# Patient Record
Sex: Female | Born: 1955 | Race: White | Hispanic: No | Marital: Married | Smoking: Never smoker
Health system: Southern US, Community
[De-identification: ages and names within clinical notes are randomized; demographics above are authoritative.]

## PROBLEM LIST (undated history)

## (undated) DIAGNOSIS — D219 Benign neoplasm of connective and other soft tissue, unspecified: Secondary | ICD-10-CM

## (undated) DIAGNOSIS — D649 Anemia, unspecified: Secondary | ICD-10-CM

## (undated) DIAGNOSIS — Z148 Genetic carrier of other disease: Secondary | ICD-10-CM

## (undated) DIAGNOSIS — R011 Cardiac murmur, unspecified: Secondary | ICD-10-CM

## (undated) DIAGNOSIS — M1990 Unspecified osteoarthritis, unspecified site: Secondary | ICD-10-CM

## (undated) HISTORY — PX: COLONOSCOPY: SHX174

## (undated) HISTORY — DX: Benign neoplasm of connective and other soft tissue, unspecified: D21.9

## (undated) HISTORY — DX: Unspecified osteoarthritis, unspecified site: M19.90

## (undated) HISTORY — DX: Anemia, unspecified: D64.9

## (undated) HISTORY — PX: TONSILLECTOMY: SUR1361

## (undated) HISTORY — DX: Cardiac murmur, unspecified: R01.1

## (undated) HISTORY — DX: Genetic carrier of other disease: Z14.8

---

## 2000-12-17 HISTORY — PX: APPENDECTOMY: SHX54

## 2000-12-17 HISTORY — PX: ABDOMINAL HYSTERECTOMY: SHX81

## 2011-10-31 ENCOUNTER — Other Ambulatory Visit: Payer: Self-pay | Admitting: Obstetrics and Gynecology

## 2011-10-31 DIAGNOSIS — R928 Other abnormal and inconclusive findings on diagnostic imaging of breast: Secondary | ICD-10-CM

## 2011-11-14 ENCOUNTER — Ambulatory Visit
Admission: RE | Admit: 2011-11-14 | Discharge: 2011-11-14 | Disposition: A | Discharge status: Home / Self Care | Payer: BC Managed Care – PPO | Source: Ambulatory Visit | Attending: Obstetrics and Gynecology | Admitting: Obstetrics and Gynecology

## 2011-11-14 DIAGNOSIS — R928 Other abnormal and inconclusive findings on diagnostic imaging of breast: Secondary | ICD-10-CM

## 2014-05-21 ENCOUNTER — Telehealth: Payer: Self-pay | Admitting: Obstetrics and Gynecology

## 2014-05-21 NOTE — Telephone Encounter (Signed)
Calling to confirm appt

## 2014-05-24 NOTE — Telephone Encounter (Signed)
CONFIRMED.

## 2014-05-28 ENCOUNTER — Encounter: Payer: Self-pay | Admitting: Obstetrics and Gynecology

## 2014-05-28 ENCOUNTER — Ambulatory Visit (INDEPENDENT_AMBULATORY_CARE_PROVIDER_SITE_OTHER): Payer: BC Managed Care – PPO | Admitting: Obstetrics and Gynecology

## 2014-05-28 VITALS — BP 122/80 | HR 70 | Resp 16 | Ht 60.0 in | Wt 176.4 lb

## 2014-05-28 DIAGNOSIS — Z0000 Encounter for general adult medical examination without abnormal findings: Secondary | ICD-10-CM

## 2014-05-28 DIAGNOSIS — B372 Candidiasis of skin and nail: Secondary | ICD-10-CM

## 2014-05-28 DIAGNOSIS — Z01419 Encounter for gynecological examination (general) (routine) without abnormal findings: Secondary | ICD-10-CM

## 2014-05-28 MED ORDER — NYSTATIN 100000 UNIT/GM EX POWD: Freq: Three times a day (TID) | CUTANEOUS | Status: DC

## 2014-05-28 NOTE — Progress Notes (Signed)
Patient ID: Mallory Lopez, female   DOB: 1956/02/01, 58 y.o.   MRN: [id number] GYNECOLOGY VISIT  PCP:   Berneta Sages, MD  Referring provider:   HPI: 58 y.o.   Married  Caucasian  female   [phone] with No LMP recorded. Patient has had a hysterectomy.   here for   AEX. Some vaginal dryness.  Uses water based lubricant.  Sufficient.  Hot flashes and night sweats resolved.   Some leakage of urine with forceful maneuvers. "I have stress incontinence." No pad use.  Does Kegel's but not often.   Some osteoarthritis. Sees Dr. Dagmar Hait.   Son getting married next year.   Hgb:    PCP Urine:  Neg  GYNECOLOGIC HISTORY: No LMP recorded. Patient has had a hysterectomy. Sexually active:  yes Partner preference: female Contraception:   Hysterectomy Menopausal hormone therapy: n/a DES exposure:  no Blood transfusions:  no Sexually transmitted diseases: no   GYN procedures and prior surgeries: Total Abdominal Hysterectomy  Last mammogram:   11-14-11 revealed prominent lymph node in right breast, otherwise normal:[facility].              Last pap and high risk HPV testing:   2012 wnl History of abnormal pap smear: no    OB History   Grav Para Term Preterm Abortions TAB SAB Ect Mult Living   3 2 2  1     2        LIFESTYLE: Exercise:  no             Tobacco: no Alcohol:  no Drug use:  no  OTHER HEALTH MAINTENANCE: Tetanus/TDap:  2009 Gardisil:             n/a Influenza:           09/2013 Zostavax:            n/a  Bone density:     n/a Colonoscopy:     05/2008 w[place] in [place].  Next colonoscopy 10 years.  Cholesterol check: 03/2014 wnl  Family History  Problem Relation Age of Onset  . Lung cancer Mother   . Prostate cancer Father   . Seizures Brother     Hx of Grand mal and Petite mal seizsures as teenager--none since  . Lung cancer Maternal Grandmother     There are no active problems to display for this patient.  Past Medical History  Diagnosis Date  . Anemia    prior to hysterectomy  . Hemochromatosis carrier   . Fibroid   . Heart murmur     functional    Past Surgical History  Procedure Laterality Date  . Abdominal hysterectomy  2002  . Appendectomy  2002    ALLERGIES: Codeine  No current outpatient prescriptions on file.   No current facility-administered medications for this visit.     ROS:  Pertinent items are noted in HPI.  SOCIAL HISTORY:  Married.   PHYSICAL EXAMINATION:    BP 122/80  Pulse 70  Resp 16  Ht 5' (1.524 m)  Wt 176 lb 6.4 oz (80.015 kg)  BMI 34.45 kg/m2   Wt Readings from Last 3 Encounters:  05/28/14 176 lb 6.4 oz (80.015 kg)     Ht Readings from Last 3 Encounters:  05/28/14 5' (1.524 m)    General appearance: alert, cooperative and appears stated age Head: Normocephalic, without obvious abnormality, atraumatic Neck: no adenopathy, supple, symmetrical, trachea midline and thyroid not enlarged, symmetric, no tenderness/mass/nodules Lungs: clear to  auscultation bilaterally Breasts: Inspection negative, No nipple retraction or dimpling, No nipple discharge or bleeding, No axillary or supraclavicular adenopathy, Normal to palpation without dominant masses Heart: regular rate and rhythm Abdomen: Pannus with erythematous patch on left side, soft, non-tender; no masses,  no organomegaly Extremities: extremities normal, atraumatic, no cyanosis or edema Skin: Skin color, texture, turgor normal. No rashes or lesions Lymph nodes: Cervical, supraclavicular, and axillary nodes normal. No abnormal inguinal nodes palpated Neurologic: Grossly normal  Pelvic: External genitalia:  no lesions              Urethra:  normal appearing urethra with no masses, tenderness or lesions              Bartholins and Skenes: normal                 Vagina: normal appearing vagina with normal color and discharge, no lesions              Cervix: absent              Pap and high risk HPV testing done: no.            Bimanual Exam:   Uterus:   absent                                      Adnexa: normal adnexa in size, nontender and no masses                                      Rectovaginal: Confirms                                      Anus:  normal sphincter tone, no lesions  ASSESSMENT  Normal gynecologic exam. Status post TAH. Candida of flexural skin.  Mild genuine stress incontinence.   PLAN  Mammogram recommended yearly.  [facility]. Pap smear and high risk HPV testing not indicated. Counseled on self breast exam, Calcium and vitamin D intake, exercise. Nystatin powder.  See Epic orders. Kegel's.  Return annually or prn   An After Visit Summary was printed and given to the patient.

## 2014-05-28 NOTE — Patient Instructions (Signed)

## 2014-06-25 ENCOUNTER — Other Ambulatory Visit: Payer: Self-pay

## 2014-06-25 DIAGNOSIS — Z1231 Encounter for screening mammogram for malignant neoplasm of breast: Secondary | ICD-10-CM

## 2014-07-02 ENCOUNTER — Ambulatory Visit
Admission: RE | Admit: 2014-07-02 | Discharge: 2014-07-02 | Disposition: A | Discharge status: Home / Self Care | Payer: BC Managed Care – PPO | Source: Ambulatory Visit

## 2014-07-02 DIAGNOSIS — Z1231 Encounter for screening mammogram for malignant neoplasm of breast: Secondary | ICD-10-CM

## 2014-07-21 ENCOUNTER — Encounter: Payer: Self-pay | Admitting: Obstetrics and Gynecology

## 2014-10-18 ENCOUNTER — Encounter: Payer: Self-pay | Admitting: Obstetrics and Gynecology

## 2015-04-04 ENCOUNTER — Telehealth: Payer: Self-pay | Admitting: Obstetrics and Gynecology

## 2015-04-04 NOTE — Telephone Encounter (Signed)
LMTCB about canceled appointment °

## 2015-06-03 ENCOUNTER — Ambulatory Visit: Payer: BC Managed Care – PPO | Admitting: Obstetrics and Gynecology

## 2015-06-08 ENCOUNTER — Encounter: Payer: Self-pay | Admitting: Obstetrics and Gynecology

## 2015-06-08 ENCOUNTER — Ambulatory Visit (INDEPENDENT_AMBULATORY_CARE_PROVIDER_SITE_OTHER): Payer: Managed Care, Other (non HMO) | Admitting: Obstetrics and Gynecology

## 2015-06-08 VITALS — BP 122/70 | HR 80 | Resp 16 | Ht 60.0 in | Wt 189.0 lb

## 2015-06-08 DIAGNOSIS — Z01419 Encounter for gynecological examination (general) (routine) without abnormal findings: Secondary | ICD-10-CM

## 2015-06-08 DIAGNOSIS — R1900 Intra-abdominal and pelvic swelling, mass and lump, unspecified site: Secondary | ICD-10-CM

## 2015-06-08 NOTE — Progress Notes (Signed)
Patient ID: Mallory Lopez, female   DOB: Oct 22, 1956, 59 y.o.   MRN: [id number]  59 y.o. G75P2012 Married Caucasian female here for annual exam.    Feels like she has gained weight.   Stress incontinence is not worse.  Doing Kegel exercises.  PCP:  Dr Radene Gunning  No LMP recorded. Patient has had a hysterectomy.          Sexually active: Yes.    The current method of family planning is status post hysterectomy.   Ovaries remain.  Exercising: Yes.    walking Smoker:  no  Health Maintenance: Pap:  2012 History of abnormal Pap:  no MMG:  07-02-14 3D WNL fibroglandular density [facility] Colonoscopy:  2009 WNL per patient  BMD:  2015   Result  Normal per patient.  Uncertain if she has some osteopenia of the hip. TDaP: 2014 per patient - PCP office Screening Labs:PCP does labs    reports that she has never smoked. She has never used smokeless tobacco. She reports that she does not drink alcohol or use illicit drugs.  Past Medical History  Diagnosis Date  . Anemia     prior to hysterectomy  . Hemochromatosis carrier   . Fibroid   . Heart murmur     functional    Past Surgical History  Procedure Laterality Date  . Abdominal hysterectomy  2002  . Appendectomy  2002    Current Outpatient Prescriptions  Medication Sig Dispense Refill  . [place]-Glucosamine-Vit D (GLUCOSAMINE COMPLEX PO) Take by mouth.    . cholecalciferol (VITAMIN D) 1000 UNITS tablet Take 1,000 Units by mouth daily.     No current facility-administered medications for this visit.    Family History  Problem Relation Age of Onset  . Lung cancer Mother   . Prostate cancer Father   . Seizures Brother     Hx of Grand mal and Petite mal seizsures as teenager--none since  . Lung cancer Maternal Grandmother     ROS:  Pertinent items are noted in HPI.  Otherwise, a comprehensive ROS was negative.  Exam:   BP 122/70 mmHg  Pulse 80  Resp 16  Ht 5' (1.524 m)  Wt 189 lb (85.73 kg)  BMI 36.91 kg/m2    General  appearance: alert, cooperative and appears stated age Head: Normocephalic, without obvious abnormality, atraumatic Neck: no adenopathy, supple, symmetrical, trachea midline and thyroid normal to inspection and palpation Lungs: clear to auscultation bilaterally Breasts: normal appearance, no masses or tenderness, Inspection negative, No nipple retraction or dimpling, No nipple discharge or bleeding, No axillary or supraclavicular adenopathy Heart: regular rate and rhythm Abdomen: soft, non-tender; bowel sounds normal; no masses,  no organomegaly Extremities: extremities normal, atraumatic, no cyanosis or edema Skin: Skin color, texture, turgor normal. No rashes or lesions Lymph nodes: Cervical, supraclavicular, and axillary nodes normal. No abnormal inguinal nodes palpated Neurologic: Grossly normal  Pelvic: External genitalia:  no lesions              Urethra:  normal appearing urethra with no masses, tenderness or lesions              Bartholins and Skenes: normal                 Vagina: normal appearing vagina with normal color and discharge, no lesions              Cervix: no lesions  Pap taken: No. Bimanual Exam:  Uterus:  uterus absent              Adnexa: 3 cm mass at vaginal apex.  Bowel versus adnexal mass.              Rectovaginal: Yes.  .  Confirms.              Anus:  normal sphincter tone, no lesions  Chaperone was present for exam.  Assessment:   Well woman visit with normal exam. Status post TAH.  Pelvic mass - bowel loop versus adnexal mass. Osteopenia?  BMD not available at this time.  Mild GSI.   Plan: Yearly mammogram recommended after age 59.  Recommended self breast exam.  Pap and HR HPV as above. Discussed Calcium, Vitamin D, regular exercise program including cardiovascular and weight bearing exercise. Labs performed.  No..     Return for pelvic ultrasound.  Pelvic exam findings discussed. Refills given on medications.  No..    Bone density  follow up through PCP.  Kegel's. Follow up annually and prn.   After visit summary provided.

## 2015-06-08 NOTE — Patient Instructions (Signed)

## 2015-06-30 ENCOUNTER — Ambulatory Visit (INDEPENDENT_AMBULATORY_CARE_PROVIDER_SITE_OTHER): Payer: Managed Care, Other (non HMO) | Admitting: Obstetrics and Gynecology

## 2015-06-30 ENCOUNTER — Ambulatory Visit (INDEPENDENT_AMBULATORY_CARE_PROVIDER_SITE_OTHER): Payer: Managed Care, Other (non HMO)

## 2015-06-30 ENCOUNTER — Encounter: Payer: Self-pay | Admitting: Obstetrics and Gynecology

## 2015-06-30 VITALS — BP 120/70 | HR 76 | Resp 18 | Ht 60.0 in | Wt 188.0 lb

## 2015-06-30 DIAGNOSIS — R1900 Intra-abdominal and pelvic swelling, mass and lump, unspecified site: Secondary | ICD-10-CM

## 2015-06-30 DIAGNOSIS — N8320 Unspecified ovarian cysts: Secondary | ICD-10-CM

## 2015-06-30 DIAGNOSIS — N83202 Unspecified ovarian cyst, left side: Secondary | ICD-10-CM

## 2015-06-30 NOTE — Patient Instructions (Signed)
Diagnostic Laparoscopy Laparoscopy is a surgical procedure. It is used to diagnose and treat diseases inside the belly (abdomen). It is usually a brief, common, and relatively simple procedure. The laparoscopeis a thin, lighted, pencil-sized instrument. It is like a telescope. It is inserted into your abdomen through a small cut (incision). Your caregiver can look at the organs inside your body through this instrument. He or she can see if there is anything abnormal. Laparoscopy can be done either in a hospital or outpatient clinic. You may be given a mild sedative to help you relax before the procedure. Once in the operating room, you will be given a drug to make you sleep (general anesthesia). Laparoscopy usually lasts less than 1 hour. After the procedure, you will be monitored in a recovery area until you are stable and doing well. Once you are home, it will take 2 to 3 days to fully recover. RISKS AND COMPLICATIONS  Laparoscopy has relatively few risks. Your caregiver will discuss the risks with you before the procedure. Some problems that can occur include:  Infection.  Bleeding.  Damage to other organs.  Anesthetic side effects. PROCEDURE Once you receive anesthesia, your surgeon inflates the abdomen with a harmless gas (carbon dioxide). This makes the organs easier to see. The laparoscope is inserted into the abdomen through a small incision. This allows your surgeon to see into the abdomen. Other small instruments are also inserted into the abdomen through other small openings. Many surgeons attach a video camera to the laparoscope to enlarge the view. During a diagnostic laparoscopy, the surgeon may be looking for inflammation, infection, or cancer. Your surgeon may take tissue samples(biopsies). The samples are sent to a specialist in looking at cells and tissue samples (pathologist). The pathologist examines them under a microscope. Biopsies can help to diagnose or confirm a  disease. AFTER THE PROCEDURE   The gas is released from inside the abdomen.  The incisions are closed with stitches (sutures). Because these incisions are small (usually less than 1/2 inch), there is usually minimal discomfort after the procedure. There may be some mild discomfort in the throat. This is from the tube placed in the throat while you were sleeping. You may have some mild abdominal discomfort. There may also be discomfort from the instrument placement incisions in the abdomen.  The recovery time is shortened as long as there are no complications.  You will rest in a recovery room until stable and doing well. As long as there are no complications, you may be allowed to go home. FINDING OUT THE RESULTS OF YOUR TEST Not all test results are available during your visit. If your test results are not back during the visit, make an appointment with your caregiver to find out the results. Do not assume everything is normal if you have not heard from your caregiver or the medical facility. It is important for you to follow up on all of your test results. HOME CARE INSTRUCTIONS   Take all medicines as directed.  Only take over-the-counter or prescription medicines for pain, discomfort, or fever as directed by your caregiver.  Resume daily activities as directed.  Showers are preferred over baths.  You may resume sexual activities in 1 week or as directed.  Do not drive while taking narcotics. SEEK MEDICAL CARE IF:   There is increasing abdominal pain.  There is new pain in the shoulders (shoulder strap areas).  You feel lightheaded or faint.  You have the chills.  You or   your child has an oral temperature above 102 F (38.9 C).  There is pus-like (purulent) drainage from any of the wounds.  You are unable to pass gas or have a bowel movement.  You feel sick to your stomach (nauseous) or throw up (vomit). MAKE SURE YOU:   Understand these instructions.  Will watch  your condition.  Will get help right away if you are not doing well or get worse. Document Released: 03/11/2001 Document Revised: 03/30/2013 Document Reviewed: 12/03/2007 [facility] Patient Information 2015 [place], [place]. This information is not intended to replace advice given to you by your health care provider. Make sure you discuss any questions you have with your health care provider.  Bilateral Salpingo-Oophorectomy Bilateral salpingo-oophorectomy is the surgical removal of both fallopian tubes and both ovaries. The ovaries are small organs that produce eggs in women. The fallopian tubes transport the egg from the ovary to the womb (uterus). Usually, when this surgery is done, the uterus was previously removed. A bilateral salpingo-oophorectomy may be done to treat cancer or to reduce the risk of cancer in women who are at high risk. Removing both fallopian tubes and both ovaries will make you unable to become pregnant (sterile). It will also put you into menopause so that you will no longer have menstrual periods and may have menopausal symptoms such as hot flashes, night sweats, and mood changes. It will not affect your sex drive. LET [facility] CARE PROVIDER KNOW ABOUT:  Any allergies you have.  All medicines you are taking, including vitamins, herbs, eye drops, creams, and over-the-counter medicines.  Previous problems you or members of your family have had with the use of anesthetics.  Any blood disorders you have.  Previous surgeries you have had.  Medical conditions you have. RISKS AND COMPLICATIONS Generally, this is a safe procedure. However, as with any procedure, complications can occur. Possible complications include:  Injury to surrounding organs.  Bleeding.  Infection.  Blood clots in the legs or lungs.  Problems related to anesthesia. BEFORE THE PROCEDURE  Ask your health care provider about changing or stopping your regular medicines. You may need to stop  taking certain medicines, such as aspirin or blood thinners, at least 1 week before the surgery.  Do not eat or drink anything for at least 8 hours before the surgery.  If you smoke, do not smoke for at least 2 weeks before the surgery.  Make plans to have someone drive you home after the procedure or after your hospital stay. Also arrange for someone to help you with activities during recovery. PROCEDURE   You will be given medicine to help you relax before the procedure (sedative). You will then be given medicine to make you sleep through the procedure (general anesthetic). These medicines will be given through an IV access tube that is put into one of your veins.  Once you are asleep, your lower abdomen will be shaved and cleaned. A thin, flexible tube (catheter) will be placed in your bladder.  The surgeon may use a laparoscopic, robotic, or open technique for this surgery:  In the laparoscopic technique, the surgery is done through two small cuts (incisions) in the abdomen. A thin, lighted tube with a tiny camera on the end (laparoscope) is inserted into one of the incisions. The tools needed for the procedure are put through the other incision.  A robotic technique may be chosen to perform complex surgery in a small space. In the robotic technique, small incisions will be  made. A camera and surgical instruments are passed through the incisions. Surgical instruments will be controlled with the help of a robotic arm.  In the open technique, the surgery is done through one large incision in the abdomen.  Using any of these techniques, the surgeon removes the fallopian tubes and ovaries. The blood vessels will be clamped and tied.  The surgeon then uses staples or stitches to close the incision or incisions. AFTER THE PROCEDURE  You will be taken to a recovery area where you will be monitored for 1 to 3 hours. Your blood pressure, pulse, and temperature will be checked often. You will  remain in the recovery area until you are stable and waking up.  If the laparoscopic technique was used, you may be allowed to go home after several hours. You may have some shoulder pain after the laparoscopic procedure. This is normal and usually goes away in a day or two.  If the open technique was used, you will be admitted to the hospital for a couple of days.  You will be given pain medicine as needed.  The IV access tube and catheter will be removed before you are discharged. Document Released: 12/03/2005 Document Revised: 12/08/2013 Document Reviewed: 05/27/2013 [facility] Patient Information 2015 [place], [place]. This information is not intended to replace advice given to you by your health care provider. Make sure you discuss any questions you have with your health care provider.  Ovarian Cyst An ovarian cyst is a fluid-filled sac that forms on an ovary. The ovaries are small organs that produce eggs in women. Various types of cysts can form on the ovaries. Most are not cancerous. Many do not cause problems, and they often go away on their own. Some may cause symptoms and require treatment. Common types of ovarian cysts include:  Functional cysts--These cysts may occur every month during the menstrual cycle. This is normal. The cysts usually go away with the next menstrual cycle if the woman does not get pregnant. Usually, there are no symptoms with a functional cyst.  Endometrioma cysts--These cysts form from the tissue that lines the uterus. They are also called "chocolate cysts" because they become filled with blood that turns brown. This type of cyst can cause pain in the lower abdomen during intercourse and with your menstrual period.  Cystadenoma cysts--This type develops from the cells on the outside of the ovary. These cysts can get very big and cause lower abdomen pain and pain with intercourse. This type of cyst can twist on itself, cut off its blood supply, and cause severe  pain. It can also easily rupture and cause a lot of pain.  Dermoid cysts--This type of cyst is sometimes found in both ovaries. These cysts may contain different kinds of body tissue, such as skin, teeth, hair, or cartilage. They usually do not cause symptoms unless they get very big.  Theca lutein cysts--These cysts occur when too much of a certain hormone (human chorionic gonadotropin) is produced and overstimulates the ovaries to produce an egg. This is most common after procedures used to assist with the conception of a baby (in vitro fertilization). CAUSES   Fertility drugs can cause a condition in which multiple large cysts are formed on the ovaries. This is called ovarian hyperstimulation syndrome.  A condition called polycystic ovary syndrome can cause hormonal imbalances that can lead to nonfunctional ovarian cysts. SIGNS AND SYMPTOMS  Many ovarian cysts do not cause symptoms. If symptoms are present, they may include:  Pelvic pain or pressure.  Pain in the lower abdomen.  Pain during sexual intercourse.  Increasing girth (swelling) of the abdomen.  Abnormal menstrual periods.  Increasing pain with menstrual periods.  Stopping having menstrual periods without being pregnant. DIAGNOSIS  These cysts are commonly found during a routine or annual pelvic exam. Tests may be ordered to find out more about the cyst. These tests may include:  Ultrasound.  X-ray of the pelvis.  CT scan.  MRI.  Blood tests. TREATMENT  Many ovarian cysts go away on their own without treatment. Your health care provider may want to check your cyst regularly for 2-3 months to see if it changes. For women in menopause, it is particularly important to monitor a cyst closely because of the higher rate of ovarian cancer in menopausal women. When treatment is needed, it may include any of the following:  A procedure to drain the cyst (aspiration). This may be done using a long needle and ultrasound. It  can also be done through a laparoscopic procedure. This involves using a thin, lighted tube with a tiny camera on the end (laparoscope) inserted through a small incision.  Surgery to remove the whole cyst. This may be done using laparoscopic surgery or an open surgery involving a larger incision in the lower abdomen.  Hormone treatment or birth control pills. These methods are sometimes used to help dissolve a cyst. HOME CARE INSTRUCTIONS   Only take over-the-counter or prescription medicines as directed by your health care provider.  Follow up with your health care provider as directed.  Get regular pelvic exams and Pap tests. SEEK MEDICAL CARE IF:   Your periods are late, irregular, or painful, or they stop.  Your pelvic pain or abdominal pain does not go away.  Your abdomen becomes larger or swollen.  You have pressure on your bladder or trouble emptying your bladder completely.  You have pain during sexual intercourse.  You have feelings of fullness, pressure, or discomfort in your stomach.  You lose weight for no apparent reason.  You feel generally ill.  You become constipated.  You lose your appetite.  You develop acne.  You have an increase in body and facial hair.  You are gaining weight, without changing your exercise and eating habits.  You think you are pregnant. SEEK IMMEDIATE MEDICAL CARE IF:   You have increasing abdominal pain.  You feel sick to your stomach (nauseous), and you throw up (vomit).  You develop a fever that comes on suddenly.  You have abdominal pain during a bowel movement.  Your menstrual periods become heavier than usual. MAKE SURE YOU:  Understand these instructions.  Will watch your condition.  Will get help right away if you are not doing well or get worse. Document Released: 12/03/2005 Document Revised: 12/08/2013 Document Reviewed: 08/10/2013 [facility] Patient Information 2015 [place], [place]. This information is not  intended to replace advice given to you by your health care provider. Make sure you discuss any questions you have with your health care provider.

## 2015-06-30 NOTE — Progress Notes (Signed)
Subjective  59 y.o. [id number]  Caucasian female here for pelvic ultrasound for right adnexal mass palpated at annual exam.  Status post total abdominal hysterectomy for uterine fibroids.  Ovaries remain.   Denies pelvic pain.   No hot flashes.    Objective  Pelvic ultrasound images and report reviewed with patient.  Uterus - absent.  EMS - NA Ovaries - Right ovary normal.  Left adnexal mass 5.2 x 5.6 cm with floating? Calcification 5 mm. Free fluid - no.        Assessment  Left adnexal mass.  Complex by criteria.  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 Status post TAH.  Plan  Discussion of ovarian cysts.  CA125.  Discussed laparoscopic bilateral salpingo-oophorectomy (with pelvic washings) versus follow up ultrasound in 6 weeks.  I am leaning toward surgical care as there is a solid? Feature to the mass and she had no palpable mass last year with my pelvic exam of her.  Written information on cysts, laparoscopy , BSO.  __15_____ minutes face to face time of which over 50% was spent in counseling.   After visit summary to patient.

## 2015-07-01 LAB — CA 125: CA 125: 7 U/mL (ref <35)

## 2015-07-05 ENCOUNTER — Telehealth: Payer: Self-pay | Admitting: Obstetrics and Gynecology

## 2015-07-05 NOTE — Telephone Encounter (Signed)
Called patient to review benefits for surgery. Left voicemail to call back and review. °

## 2015-07-06 NOTE — Telephone Encounter (Signed)
Call to patient, left message to call back. 

## 2015-07-15 ENCOUNTER — Encounter (HOSPITAL_COMMUNITY): Payer: Self-pay | Admitting: *Deleted

## 2015-07-20 ENCOUNTER — Telehealth: Payer: Self-pay | Admitting: *Deleted

## 2015-07-20 NOTE — Telephone Encounter (Signed)
See previous phone encounter. Patient advised surgery is scheduled for Tuesday 08-16-15 at 0730 at [facility] as previously discussed.  Surgery instruction sheet reviewed and printed copy mailed, see scanned copy.   Routing to provider for final review. Patient agreeable to disposition. Will close encounter.

## 2015-07-20 NOTE — Telephone Encounter (Signed)
See next phone message.  Routing to provider for final review.  Will close encounter.

## 2015-07-27 ENCOUNTER — Ambulatory Visit: Payer: Managed Care, Other (non HMO) | Admitting: Obstetrics and Gynecology

## 2015-07-29 ENCOUNTER — Ambulatory Visit (INDEPENDENT_AMBULATORY_CARE_PROVIDER_SITE_OTHER): Payer: Managed Care, Other (non HMO) | Admitting: Obstetrics and Gynecology

## 2015-07-29 ENCOUNTER — Encounter: Payer: Self-pay | Admitting: Obstetrics and Gynecology

## 2015-07-29 VITALS — BP 128/76 | HR 76 | Ht 60.0 in | Wt 189.8 lb

## 2015-07-29 DIAGNOSIS — N83202 Unspecified ovarian cyst, left side: Secondary | ICD-10-CM

## 2015-07-29 DIAGNOSIS — N8320 Unspecified ovarian cysts: Secondary | ICD-10-CM

## 2015-07-29 DIAGNOSIS — Z0289 Encounter for other administrative examinations: Secondary | ICD-10-CM

## 2015-07-29 NOTE — Progress Notes (Signed)
Patient ID: Mallory Lopez, female   DOB: [birth year], 59 y.o.   MRN: [id number] GYNECOLOGY  VISIT   HPI: 59 y.o.   Married  Caucasian  female   [phone] with No LMP recorded. Patient has had a hysterectomy.   here for surgery consult.   Husband is present for the entire visit.   Left adnexal mass palpated at annual examination 06/08/15.  Pelvic ultrasound 06/30/15: Uterus - absent.  EMS - NA Ovaries - Right ovary normal. Left adnexal mass 5.2 x 5.6 cm with floating? Calcification 5 mm. Free fluid - no.   CA125 = 7 on 06/30/15.  GYNECOLOGIC HISTORY: No LMP recorded. Patient has had a hysterectomy. Contraception: Hysterectomy Menopausal hormone therapy: none Last mammogram: 07-02-14 Density Cat[facility]B/Neg:[facility]. Last pap smear: 2012 normal        OB History    Gravida Para Term Preterm AB TAB SAB Ectopic Multiple Living   3 2 2  1     2          There are no active problems to display for this patient.   Past Medical History  Diagnosis Date  . Anemia     prior to hysterectomy  . Hemochromatosis carrier   . Fibroid   . Heart murmur     functional    Past Surgical History  Procedure Laterality Date  . Abdominal hysterectomy  2002  . Appendectomy  2002    Current Outpatient Prescriptions  Medication Sig Dispense Refill  . Boswellia-Glucosamine-Vit D (GLUCOSAMINE COMPLEX PO) Take by mouth.    . Calcium Citrate-Vitamin D (CALCIUM + D PO) Take by mouth.    . cholecalciferol (VITAMIN D) 1000 UNITS tablet Take 1,000 Units by mouth daily.     No current facility-administered medications for this visit.     ALLERGIES: Codeine  Family History  Problem Relation Age of Onset  . Lung cancer Mother   . Prostate cancer Father   . Seizures Brother     Hx of Grand mal and Petite mal seizsures as teenager--none since  . Lung cancer Maternal Grandmother     Social History   Social History  . Marital Status: Married    Spouse Name: N/A  . Number of Children: N/A  .  Years of Education: N/A   Occupational History  . Not on file.   Social History Main Topics  . Smoking status: Never Smoker   . Smokeless tobacco: Never Used  . Alcohol Use: No  . Drug Use: No  . Sexual Activity:    Partners: Male    Birth Control/ Protection: Surgical     Comment: Hysterectomy   Other Topics Concern  . Not on file   Social History Narrative    ROS:  Pertinent items are noted in HPI.  PHYSICAL EXAMINATION:    BP 128/76 mmHg  Pulse 76  Ht 5' (1.524 m)  Wt 189 lb 12.8 oz (86.093 kg)  BMI 37.07 kg/m2    General appearance: alert, cooperative and appears stated age Head: Normocephalic, without obvious abnormality, atraumatic Neck: no adenopathy, supple, symmetrical, trachea midline and thyroid normal to inspection and palpation Lungs: clear to auscultation bilaterally Heart: regular rate and rhythmAbdomen: Pfannenstiel incision, soft, non-tender; bowel sounds normal; no masses,  no organomegaly Extremities: extremities normal, atraumatic, no cyanosis or edema Skin: Skin color, texture, turgor normal. No rashes or lesions Lymph nodes: Cervical, supraclavicular, and axillary nodes normal. No abnormal inguinal nodes palpated Neurologic: Grossly normal  Pelvic: External genitalia:  no  lesions              Urethra:  normal appearing urethra with no masses, tenderness or lesions              Bartholins and Skenes: normal                 Vagina: normal appearing vagina with normal color and discharge, no lesions              Cervix: absent           Bimanual Exam:  Uterus:  uterus absent              Adnexa: Subtle mass of the left adnexa.  Right adnexa normal.               Rectovaginal: Yes.  .  Confirms.              Anus:  normal sphincter tone, no lesions  Chaperone was present for exam.  Informal transvaginal ultrasound performed and left adnexal mass present with calcification of the wall noted.   ASSESSMENT  Status post TAH. Left adnexal mass  with complex feature.  Normal CA125.   PLAN  Proceed with laparoscopic bilateral salpingo-oophorectomy and collection of pelvic washings.  I reviewed benefits and risks which include but are not limited to bleeding, infection, damage to surrounding organs, pneumonia, reaction to anesthesia, DVt, PE, death, need for reoperation, hernia formation, continued pain, and need to convert to a traditional laparotomy incision to complete the procedure.  Discussed surgical expectations and recovery.  Patient wishes to proceed with surgery.  She will do a magnesium citrate bowel prep.       An After Visit Summary was printed and given to the patient.  _25_____ minutes face to face time of which over 50% was spent in counseling.

## 2015-08-01 ENCOUNTER — Telehealth: Payer: Self-pay | Admitting: *Deleted

## 2015-08-01 NOTE — Telephone Encounter (Signed)
Call to patient. Notified that surgery start time has been adjusted to 1030 on 08-16-15. Arrive at 0900. Patietn voiced understanding and is agreeable.  Routing to provider for final review. Will close encounter.

## 2015-08-15 NOTE — H&P (Signed)
Nunzio Cobbs, MD at 07/29/2015 3:01 PM     Status: Signed       Expand All Collapse All   Patient ID: Mallory Lopez, female DOB: 19-Oct-1956, 59 y.o. MRN: [id number] GYNECOLOGY VISIT  HPI: 59 y.o. Married Caucasian female  [phone] with No LMP recorded. Patient has had a hysterectomy.  here for surgery consult.  Husband is present for the entire visit.   Left adnexal mass palpated at annual examination 06/08/15.  Pelvic ultrasound 06/30/15: Uterus - absent.  EMS - NA Ovaries - Right ovary normal. Left adnexal mass 5.2 x 5.6 cm with floating? Calcification 5 mm. Free fluid - no.   CA125 = 7 on 06/30/15.  GYNECOLOGIC HISTORY: No LMP recorded. Patient has had a hysterectomy. Contraception: Hysterectomy Menopausal hormone therapy: none Last mammogram: 07-02-14 Density Cat[facility]B/Neg:[facility]. Last pap smear: 2012 normal   OB History    Gravida Para Term Preterm AB TAB SAB Ectopic Multiple Living   3 2 2  1     2        There are no active problems to display for this patient.   Past Medical History  Diagnosis Date  . Anemia     prior to hysterectomy  . Hemochromatosis carrier   . Fibroid   . Heart murmur     functional    Past Surgical History  Procedure Laterality Date  . Abdominal hysterectomy  2002  . Appendectomy  2002    Current Outpatient Prescriptions  Medication Sig Dispense Refill  . Boswellia-Glucosamine-Vit D (GLUCOSAMINE COMPLEX PO) Take by mouth.    . Calcium Citrate-Vitamin D (CALCIUM + D PO) Take by mouth.    . cholecalciferol (VITAMIN D) 1000 UNITS tablet Take 1,000 Units by mouth daily.     No current facility-administered medications for this visit.     ALLERGIES: Codeine  Family History  Problem Relation Age of Onset  . Lung cancer Mother   . Prostate cancer Father   . Seizures Brother     Hx of  Grand mal and Petite mal seizsures as teenager--none since  . Lung cancer Maternal Grandmother     Social History   Social History  . Marital Status: Married    Spouse Name: N/A  . Number of Children: N/A  . Years of Education: N/A   Occupational History  . Not on file.   Social History Main Topics  . Smoking status: Never Smoker   . Smokeless tobacco: Never Used  . Alcohol Use: No  . Drug Use: No  . Sexual Activity:    Partners: Male    Birth Control/ Protection: Surgical     Comment: Hysterectomy   Other Topics Concern  . Not on file   Social History Narrative    ROS: Pertinent items are noted in HPI.  PHYSICAL EXAMINATION:   BP 128/76 mmHg  Pulse 76  Ht 5' (1.524 m)  Wt 189 lb 12.8 oz (86.093 kg)  BMI 37.07 kg/m2  General appearance: alert, cooperative and appears stated age Head: Normocephalic, without obvious abnormality, atraumatic Neck: no adenopathy, supple, symmetrical, trachea midline and thyroid normal to inspection and palpation Lungs: clear to auscultation bilaterally Heart: regular rate and rhythmAbdomen: Pfannenstiel incision, soft, non-tender; bowel sounds normal; no masses, no organomegaly Extremities: extremities normal, atraumatic, no cyanosis or edema Skin: Skin color, texture, turgor normal. No rashes or lesions Lymph nodes: Cervical, supraclavicular, and axillary nodes normal. No abnormal inguinal nodes palpated Neurologic: Grossly normal  Pelvic: External  genitalia: no lesions  Urethra: normal appearing urethra with no masses, tenderness or lesions  Bartholins and Skenes: normal   Vagina: normal appearing vagina with normal color and discharge, no lesions  Cervix: absent   Bimanual Exam: Uterus: uterus absent  Adnexa: Subtle mass of the left adnexa. Right adnexa normal.    Rectovaginal: Yes. . Confirms.  Anus: normal sphincter tone, no lesions  Chaperone was present for exam.  Informal transvaginal ultrasound performed and left adnexal mass present with calcification of the wall noted.   ASSESSMENT  Status post TAH. Left adnexal mass with complex feature. Normal CA125.  PLAN  Proceed with laparoscopic bilateral salpingo-oophorectomy and collection of pelvic washings.  I reviewed benefits and risks which include but are not limited to bleeding, infection, damage to surrounding organs, pneumonia, reaction to anesthesia, DVt, PE, death, need for reoperation, hernia formation, continued pain, and need to convert to a traditional laparotomy incision to complete the procedure.  Discussed surgical expectations and recovery.  Patient wishes to proceed with surgery.  She will do a magnesium citrate bowel prep.    An After Visit Summary was printed and given to the patient.  _25_____ minutes face to face time of which over 50% was spent in counseling.

## 2015-08-16 ENCOUNTER — Ambulatory Visit (HOSPITAL_COMMUNITY): Payer: Managed Care, Other (non HMO) | Admitting: Anesthesiology

## 2015-08-16 ENCOUNTER — Ambulatory Visit (HOSPITAL_COMMUNITY)
Admission: RE | Admit: 2015-08-16 | Discharge: 2015-08-17 | Disposition: A | Discharge status: Home / Self Care | Payer: Managed Care, Other (non HMO) | Source: Ambulatory Visit | Attending: Obstetrics and Gynecology | Admitting: Obstetrics and Gynecology

## 2015-08-16 ENCOUNTER — Encounter (HOSPITAL_COMMUNITY): Payer: Self-pay | Admitting: Anesthesiology

## 2015-08-16 ENCOUNTER — Encounter (HOSPITAL_COMMUNITY)
Admission: RE | Disposition: A | Discharge status: Home / Self Care | Payer: Self-pay | Source: Ambulatory Visit | Attending: Obstetrics and Gynecology

## 2015-08-16 DIAGNOSIS — N838 Other noninflammatory disorders of ovary, fallopian tube and broad ligament: Secondary | ICD-10-CM | POA: Insufficient documentation

## 2015-08-16 DIAGNOSIS — Z90722 Acquired absence of ovaries, bilateral: Secondary | ICD-10-CM

## 2015-08-16 DIAGNOSIS — N736 Female pelvic peritoneal adhesions (postinfective): Secondary | ICD-10-CM

## 2015-08-16 DIAGNOSIS — Z90710 Acquired absence of both cervix and uterus: Secondary | ICD-10-CM | POA: Diagnosis not present

## 2015-08-16 DIAGNOSIS — N8320 Unspecified ovarian cysts: Secondary | ICD-10-CM

## 2015-08-16 DIAGNOSIS — D279 Benign neoplasm of unspecified ovary: Secondary | ICD-10-CM | POA: Diagnosis not present

## 2015-08-16 HISTORY — PX: CYSTOSCOPY: SHX5120

## 2015-08-16 HISTORY — PX: LAPAROSCOPIC BILATERAL SALPINGO OOPHERECTOMY: SHX5890

## 2015-08-16 HISTORY — PX: LAPAROSCOPIC LYSIS OF ADHESIONS: SHX5905

## 2015-08-16 LAB — BASIC METABOLIC PANEL
ANION GAP: 9 (ref 5–15)
BUN: 13 mg/dL (ref 6–20)
CALCIUM: 9.3 mg/dL (ref 8.9–10.3)
CO2: 28 mmol/L (ref 22–32)
Chloride: 103 mmol/L (ref 101–111)
Creatinine, Ser: 0.76 mg/dL (ref 0.44–1.00)
GFR calc Af Amer: >60 mL/min (ref >60)
GLUCOSE: 103 mg/dL — AB (ref 65–99)
Potassium: 3.9 mmol/L (ref 3.5–5.1)
Sodium: 140 mmol/L (ref 135–145)

## 2015-08-16 LAB — CBC
HCT: 43.7 % (ref 36.0–46.0)
Hemoglobin: 15.9 g/dL — ABNORMAL HIGH (ref 12.0–15.0)
MCH: 30.2 pg (ref 26.0–34.0)
MCHC: 36.4 g/dL — ABNORMAL HIGH (ref 30.0–36.0)
MCV: 83.1 fL (ref 78.0–100.0)
PLATELETS: 212 10*3/uL (ref 150–400)
RBC: 5.26 MIL/uL — ABNORMAL HIGH (ref 3.87–5.11)
RDW: 13.5 % (ref 11.5–15.5)
WBC: 9 10*3/uL (ref 4.0–10.5)

## 2015-08-16 SURGERY — SALPINGO-OOPHORECTOMY, BILATERAL, LAPAROSCOPIC
Anesthesia: General | Site: Bladder

## 2015-08-16 MED ORDER — LACTATED RINGERS IV SOLN
INTRAVENOUS | Status: DC
  Administered 2015-08-16 (×4): via INTRAVENOUS

## 2015-08-16 MED ORDER — HYDROMORPHONE HCL 1 MG/ML IJ SOLN
INTRAMUSCULAR | Status: AC
  Filled 2015-08-16: qty 1

## 2015-08-16 MED ORDER — PROPOFOL 10 MG/ML IV BOLUS
INTRAVENOUS | Status: DC | PRN
  Administered 2015-08-16: 170 mg via INTRAVENOUS

## 2015-08-16 MED ORDER — MORPHINE SULFATE (PF) 4 MG/ML IV SOLN: 2.0000 mg | INTRAVENOUS | Status: DC | PRN

## 2015-08-16 MED ORDER — MIDAZOLAM HCL 2 MG/2ML IJ SOLN
INTRAMUSCULAR | Status: DC | PRN
  Administered 2015-08-16: 2 mg via INTRAVENOUS

## 2015-08-16 MED ORDER — ONDANSETRON HCL 4 MG/2ML IJ SOLN: 4.0000 mg | Freq: Four times a day (QID) | INTRAMUSCULAR | Status: DC | PRN

## 2015-08-16 MED ORDER — SCOPOLAMINE 1 MG/3DAYS TD PT72
MEDICATED_PATCH | TRANSDERMAL | Status: AC
  Administered 2015-08-16: 1.5 mg via TRANSDERMAL
  Filled 2015-08-16: qty 1

## 2015-08-16 MED ORDER — IBUPROFEN 600 MG PO TABS: 600.0000 mg | ORAL_TABLET | Freq: Four times a day (QID) | ORAL | Status: DC | PRN

## 2015-08-16 MED ORDER — ONDANSETRON HCL 4 MG/2ML IJ SOLN
INTRAMUSCULAR | Status: AC
  Filled 2015-08-16: qty 2

## 2015-08-16 MED ORDER — PROPOFOL 10 MG/ML IV BOLUS
INTRAVENOUS | Status: AC
  Filled 2015-08-16: qty 20

## 2015-08-16 MED ORDER — FENTANYL CITRATE (PF) 100 MCG/2ML IJ SOLN
INTRAMUSCULAR | Status: AC
  Filled 2015-08-16: qty 4

## 2015-08-16 MED ORDER — NEOSTIGMINE METHYLSULFATE 10 MG/10ML IV SOLN
INTRAVENOUS | Status: DC | PRN
  Administered 2015-08-16: 3 mg via INTRAVENOUS

## 2015-08-16 MED ORDER — ACETAMINOPHEN 160 MG/5ML PO SOLN
ORAL | Status: AC
  Filled 2015-08-16: qty 40.6

## 2015-08-16 MED ORDER — ROCURONIUM BROMIDE 100 MG/10ML IV SOLN
INTRAVENOUS | Status: AC
  Filled 2015-08-16: qty 1

## 2015-08-16 MED ORDER — DEXAMETHASONE SODIUM PHOSPHATE 10 MG/ML IJ SOLN
INTRAMUSCULAR | Status: DC | PRN
  Administered 2015-08-16: 4 mg via INTRAVENOUS

## 2015-08-16 MED ORDER — HYDROMORPHONE HCL 1 MG/ML IJ SOLN
INTRAMUSCULAR | Status: DC | PRN
  Administered 2015-08-16: 1 mg via INTRAVENOUS

## 2015-08-16 MED ORDER — LACTATED RINGERS IV SOLN
INTRAVENOUS | Status: DC
  Administered 2015-08-17: via INTRAVENOUS

## 2015-08-16 MED ORDER — BUPIVACAINE HCL (PF) 0.25 % IJ SOLN
INTRAMUSCULAR | Status: AC
  Filled 2015-08-16: qty 30

## 2015-08-16 MED ORDER — LIDOCAINE HCL (CARDIAC) 20 MG/ML IV SOLN
INTRAVENOUS | Status: AC
  Filled 2015-08-16: qty 5

## 2015-08-16 MED ORDER — STERILE WATER FOR IRRIGATION IR SOLN
Status: DC | PRN
  Administered 2015-08-16: 1000 mL via INTRAVESICAL

## 2015-08-16 MED ORDER — FENTANYL CITRATE (PF) 250 MCG/5ML IJ SOLN
INTRAMUSCULAR | Status: AC
  Filled 2015-08-16: qty 25

## 2015-08-16 MED ORDER — CEFAZOLIN SODIUM-DEXTROSE 2-3 GM-% IV SOLR
INTRAVENOUS | Status: AC
  Filled 2015-08-16: qty 50

## 2015-08-16 MED ORDER — LIDOCAINE HCL (CARDIAC) 20 MG/ML IV SOLN
INTRAVENOUS | Status: DC | PRN
  Administered 2015-08-16: 80 mg via INTRAVENOUS

## 2015-08-16 MED ORDER — LACTATED RINGERS IV SOLN
INTRAVENOUS | Status: DC
  Administered 2015-08-16: 09:00:00 via INTRAVENOUS

## 2015-08-16 MED ORDER — SCOPOLAMINE 1 MG/3DAYS TD PT72
1.0000 | MEDICATED_PATCH | Freq: Once | TRANSDERMAL | Status: DC
  Administered 2015-08-16: 1.5 mg via TRANSDERMAL

## 2015-08-16 MED ORDER — HEPARIN SODIUM (PORCINE) 5000 UNIT/ML IJ SOLN
INTRAMUSCULAR | Status: DC | PRN
  Administered 2015-08-16: 5000 [IU]

## 2015-08-16 MED ORDER — DEXAMETHASONE SODIUM PHOSPHATE 4 MG/ML IJ SOLN
INTRAMUSCULAR | Status: AC
  Filled 2015-08-16: qty 1

## 2015-08-16 MED ORDER — CEFAZOLIN SODIUM-DEXTROSE 2-3 GM-% IV SOLR
2.0000 g | INTRAVENOUS | Status: AC
  Administered 2015-08-16: 2 g via INTRAVENOUS

## 2015-08-16 MED ORDER — ONDANSETRON HCL 4 MG PO TABS: 4.0000 mg | ORAL_TABLET | Freq: Four times a day (QID) | ORAL | Status: DC | PRN

## 2015-08-16 MED ORDER — BUPIVACAINE HCL (PF) 0.25 % IJ SOLN
INTRAMUSCULAR | Status: DC | PRN
  Administered 2015-08-16: 10 mL

## 2015-08-16 MED ORDER — LACTATED RINGERS IR SOLN
Status: DC | PRN
  Administered 2015-08-16: 3000 mL

## 2015-08-16 MED ORDER — OXYCODONE-ACETAMINOPHEN 5-325 MG PO TABS: 1.0000 | ORAL_TABLET | ORAL | Status: DC | PRN

## 2015-08-16 MED ORDER — HYDROMORPHONE HCL 1 MG/ML IJ SOLN: 0.2500 mg | INTRAMUSCULAR | Status: DC | PRN

## 2015-08-16 MED ORDER — ACETAMINOPHEN 160 MG/5ML PO SOLN
975.0000 mg | Freq: Once | ORAL | Status: AC
  Administered 2015-08-16: 975 mg via ORAL

## 2015-08-16 MED ORDER — GLYCOPYRROLATE 0.2 MG/ML IJ SOLN
INTRAMUSCULAR | Status: AC
  Filled 2015-08-16: qty 3

## 2015-08-16 MED ORDER — ROCURONIUM BROMIDE 100 MG/10ML IV SOLN
INTRAVENOUS | Status: DC | PRN
Start: 2015-08-16 — End: 2015-08-16
  Administered 2015-08-16 (×2): 10 mg via INTRAVENOUS
  Administered 2015-08-16: 40 mg via INTRAVENOUS
  Administered 2015-08-16: 20 mg via INTRAVENOUS
  Administered 2015-08-16: 10 mg via INTRAVENOUS

## 2015-08-16 MED ORDER — MENTHOL 3 MG MT LOZG: 1.0000 | LOZENGE | OROMUCOSAL | Status: DC | PRN

## 2015-08-16 MED ORDER — NEOSTIGMINE METHYLSULFATE 10 MG/10ML IV SOLN
INTRAVENOUS | Status: AC
  Filled 2015-08-16: qty 1

## 2015-08-16 MED ORDER — GLYCOPYRROLATE 0.2 MG/ML IJ SOLN
INTRAMUSCULAR | Status: DC | PRN
  Administered 2015-08-16: 0.6 mg via INTRAVENOUS

## 2015-08-16 MED ORDER — MIDAZOLAM HCL 2 MG/2ML IJ SOLN
INTRAMUSCULAR | Status: AC
  Filled 2015-08-16: qty 4

## 2015-08-16 MED ORDER — HEPARIN SODIUM (PORCINE) 5000 UNIT/ML IJ SOLN
INTRAMUSCULAR | Status: AC
  Filled 2015-08-16: qty 1

## 2015-08-16 MED ORDER — FENTANYL CITRATE (PF) 100 MCG/2ML IJ SOLN
INTRAMUSCULAR | Status: DC | PRN
  Administered 2015-08-16: 100 ug via INTRAVENOUS
  Administered 2015-08-16: 50 ug via INTRAVENOUS
  Administered 2015-08-16: 100 ug via INTRAVENOUS
  Administered 2015-08-16 (×4): 50 ug via INTRAVENOUS

## 2015-08-16 SURGICAL SUPPLY — 31 items
APPLICATOR SURGIFLO ENDO (HEMOSTASIS) ×4 IMPLANT
BARRIER ADHS 3X4 INTERCEED (GAUZE/BANDAGES/DRESSINGS) IMPLANT
BENZOIN TINCTURE PRP APPL 2/3 (GAUZE/BANDAGES/DRESSINGS) IMPLANT
CABLE HIGH FREQUENCY MONO STRZ (ELECTRODE) ×4 IMPLANT
CANISTER SUCT 3000ML (MISCELLANEOUS) ×4 IMPLANT
CLOTH BEACON ORANGE TIMEOUT ST (SAFETY) ×4 IMPLANT
DRSG COVADERM PLUS 2X2 (GAUZE/BANDAGES/DRESSINGS) ×8 IMPLANT
DRSG OPSITE POSTOP 3X4 (GAUZE/BANDAGES/DRESSINGS) ×4 IMPLANT
FORCEPS CUTTING 33CM 5MM (CUTTING FORCEPS) ×4 IMPLANT
GLOVE BIO SURGEON STRL SZ 6.5 (GLOVE) ×8 IMPLANT
GOWN STRL REUS W/TWL LRG LVL3 (GOWN DISPOSABLE) ×20 IMPLANT
LIQUID BAND (GAUZE/BANDAGES/DRESSINGS) ×4 IMPLANT
NS IRRIG 1000ML POUR BTL (IV SOLUTION) IMPLANT
PACK LAPAROSCOPY BASIN (CUSTOM PROCEDURE TRAY) ×4 IMPLANT
PAD POSITIONING PINK XL (MISCELLANEOUS) ×4 IMPLANT
POUCH SPECIMEN RETRIEVAL 10MM (ENDOMECHANICALS) IMPLANT
PROTECTOR NERVE ULNAR (MISCELLANEOUS) ×4 IMPLANT
SCISSORS LAP 5X35 DISP (ENDOMECHANICALS) ×4 IMPLANT
SET IRRIG TUBING LAPAROSCOPIC (IRRIGATION / IRRIGATOR) ×4 IMPLANT
SOLUTION ELECTROLUBE (MISCELLANEOUS) IMPLANT
SURGIFLO W/THROMBIN 8M KIT (HEMOSTASIS) ×4 IMPLANT
SUT VIC AB 0 CT1 27 (SUTURE) ×2
SUT VIC AB 0 CT1 27XBRD ANBCTR (SUTURE) ×6 IMPLANT
SUT VIC AB 2-0 UR6 27 (SUTURE) ×4 IMPLANT
SUT VIC AB 4-0 PS2 18 (SUTURE) ×4 IMPLANT
SYSTEM CARTER THOMASON II (TROCAR) ×4 IMPLANT
TOWEL OR 17X24 6PK STRL BLUE (TOWEL DISPOSABLE) ×8 IMPLANT
TRAY FOLEY CATH SILVER 14FR (SET/KITS/TRAYS/PACK) ×4 IMPLANT
TROCAR XCEL DIL TIP R 11M (ENDOMECHANICALS) ×4 IMPLANT
WARMER LAPAROSCOPE (MISCELLANEOUS) ×4 IMPLANT
WATER STERILE IRR 1000ML POUR (IV SOLUTION) IMPLANT

## 2015-08-16 NOTE — Brief Op Note (Signed)
08/16/2015  4:35 PM  PATIENT:  Mallory Lopez  59 y.o. female  PRE-OPERATIVE DIAGNOSIS:  left ovarian cyst  POST-OPERATIVE DIAGNOSIS:  left ovarian cyst, pelvic adhesions  PROCEDURE:  Procedure(s): LAPAROSCOPIC BILATERAL SALPINGO OOPHORECTOMY with collection of pelvic washings (Bilateral) CYSTOSCOPY (N/A) Extensive LAPAROSCOPIC LYSIS OF ADHESIONS (N/A)  SURGEON:  Surgeon(s) and Role:    * Brook E Yisroel Ramming, MD - Primary    * Salvadore Dom, MD - Assisting  PHYSICIAN ASSISTANT: NA  ASSISTANTS: Sumner Boast, MD   ANESTHESIA:   local and general  EBL:  Total I/O In: 1600 [I.V.:1600] Out: 275 [Urine:75; Blood:200]  BLOOD ADMINISTERED:none  DRAINS: none   LOCAL MEDICATIONS USED:  MARCAINE     SPECIMEN:  Source of Specimen:  Bilateral tubes and ovaries, pelvic washings.  DISPOSITION OF SPECIMEN:  PATHOLOGY  COUNTS:  YES  TOURNIQUET:  * No tourniquets in log *  DICTATION: .Other Dictation: Dictation Number    PLAN OF CARE: Admit for overnight observation  PATIENT DISPOSITION:  PACU - hemodynamically stable.   Delay start of Pharmacological VTE agent (>24hrs) due to surgical blood loss or risk of bleeding: not applicable

## 2015-08-16 NOTE — Progress Notes (Signed)
Day of Surgery Procedure(s) (LRB): LAPAROSCOPIC BILATERAL SALPINGO OOPHORECTOMY with collection of pelvic washings (Bilateral) CYSTOSCOPY (N/A) Extensive LAPAROSCOPIC LYSIS OF ADHESIONS (N/A)  Subjective: Patient reports good pain control.  Used the bed pan once to void.  Not out of bed yet.   Objective: I have reviewed patient's vital signs and intake and output. T max 99.2, T now 98.3, BP 104/54, P 81, RR 16 I/O - 2000 IV/ UO 75 cc, EBL 200 cc.  General: alert and cooperative Resp: clear to auscultation bilaterally Cardio: regular rate and rhythm, S1, S2 normal, no murmur, click, rub or gallop GI: soft, non-tender; bowel sounds normal; no masses,  no organomegaly and incision: clean, dry and intact Extremities: PAS and Ted hose on. DPs 2+ bilaterally.   Assessment: s/p Procedure(s): LAPAROSCOPIC BILATERAL SALPINGO OOPHORECTOMY with collection of pelvic washings (Bilateral) CYSTOSCOPY (N/A) Extensive LAPAROSCOPIC LYSIS OF ADHESIONS (N/A): stable  Plan: Advance diet Advance to PO medication CBC and BMP in am.   Ambulate tonight with assistance.  Surgical findings and procedure reviewed with patient.  Questions answered.       Mallory Lopez A Quincy Simmonds 08/16/2015, 7:14 PM

## 2015-08-16 NOTE — Transfer of Care (Signed)
Immediate Anesthesia Transfer of Care Note  Patient: Mallory Lopez  Procedure(s) Performed: Procedure(s): LAPAROSCOPIC BILATERAL SALPINGO OOPHORECTOMY with collection of pelvic washings (Bilateral) CYSTOSCOPY (N/A) Extensive LAPAROSCOPIC LYSIS OF ADHESIONS (N/A)  Patient Location: PACU  Anesthesia Type:General  Level of Consciousness: awake, alert , oriented and patient cooperative  Airway & Oxygen Therapy: Patient Spontanous Breathing and Patient connected to face mask oxygen  Post-op Assessment: Report given to RN and Post -op Vital signs reviewed and stable  Post vital signs: Reviewed and stable  Last Vitals:  TEMP 99.2 BP 104/55 HR 96 RR 12 [id number]  Complications: No apparent anesthesia complications

## 2015-08-16 NOTE — Progress Notes (Signed)
Update to History and Physical  No marked change in status since office pre-op visit.   Patient examined.   OK to proceed with surgery. 

## 2015-08-16 NOTE — Anesthesia Postprocedure Evaluation (Signed)
  Anesthesia Post-op Note  Patient: Mallory Lopez  Procedure(s) Performed: Procedure(s): LAPAROSCOPIC BILATERAL SALPINGO OOPHORECTOMY with collection of pelvic washings (Bilateral) CYSTOSCOPY (N/A) Extensive LAPAROSCOPIC LYSIS OF ADHESIONS (N/A)  Patient Location: PACU  Anesthesia Type:General  Level of Consciousness: awake, alert  and oriented  Airway and Oxygen Therapy: Patient Spontanous Breathing and Patient connected to nasal cannula oxygen  Post-op Pain: mild  Post-op Assessment: Post-op Vital signs reviewed, Patient's Cardiovascular Status Stable, Respiratory Function Stable, Patent Airway, No signs of Nausea or vomiting and Pain level controlled              Post-op Vital Signs: Reviewed and stable  Last Vitals:  Filed Vitals:   08/16/15 1730  BP: 110/57  Pulse: 85  Temp: 36.9 C  Resp: 14    Complications: No apparent anesthesia complications

## 2015-08-16 NOTE — Anesthesia Preprocedure Evaluation (Signed)
Anesthesia Evaluation  Patient identified by MRN, date of birth, ID band Patient awake    Reviewed: Allergy & Precautions, H&P , Patient's Chart, lab work & pertinent test results, reviewed documented beta blocker date and time   Airway Mallampati: II  TM Distance: >3 FB Neck ROM: full    Dental no notable dental hx.    Pulmonary  breath sounds clear to auscultation  Pulmonary exam normal       Cardiovascular Rhythm:regular Rate:Normal     Neuro/Psych    GI/Hepatic   Endo/Other    Renal/GU      Musculoskeletal   Abdominal   Peds  Hematology   Anesthesia Other Findings Normal echo; EKG  Reproductive/Obstetrics                             Anesthesia Physical Anesthesia Plan  ASA: II  Anesthesia Plan: General   Post-op Pain Management:    Induction: Intravenous  Airway Management Planned: Oral ETT  Additional Equipment:   Intra-op Plan:   Post-operative Plan: Extubation in OR  Informed Consent: I have reviewed the patients History and Physical, chart, labs and discussed the procedure including the risks, benefits and alternatives for the proposed anesthesia with the patient or authorized representative who has indicated his/her understanding and acceptance.   Dental Advisory Given and Dental advisory given  Plan Discussed with: CRNA and Surgeon  Anesthesia Plan Comments: (  Discussed general anesthesia, including possible nausea, instrumentation of airway, sore throat,pulmonary aspiration, etc. I asked if the were any outstanding questions, or  concerns before we proceeded. )        Anesthesia Quick Evaluation

## 2015-08-17 ENCOUNTER — Telehealth: Payer: Self-pay | Admitting: Obstetrics and Gynecology

## 2015-08-17 ENCOUNTER — Encounter (HOSPITAL_COMMUNITY): Payer: Self-pay | Admitting: Obstetrics and Gynecology

## 2015-08-17 DIAGNOSIS — D279 Benign neoplasm of unspecified ovary: Secondary | ICD-10-CM | POA: Diagnosis not present

## 2015-08-17 LAB — BASIC METABOLIC PANEL
ANION GAP: 8 (ref 5–15)
BUN: 11 mg/dL (ref 6–20)
CO2: 27 mmol/L (ref 22–32)
Calcium: 8.7 mg/dL — ABNORMAL LOW (ref 8.9–10.3)
Chloride: 104 mmol/L (ref 101–111)
Creatinine, Ser: 0.8 mg/dL (ref 0.44–1.00)
Glucose, Bld: 125 mg/dL — ABNORMAL HIGH (ref 65–99)
POTASSIUM: 3.9 mmol/L (ref 3.5–5.1)
SODIUM: 139 mmol/L (ref 135–145)

## 2015-08-17 LAB — CBC
HCT: 37 % (ref 36.0–46.0)
Hemoglobin: 13.3 g/dL (ref 12.0–15.0)
MCH: 29.8 pg (ref 26.0–34.0)
MCHC: 35.9 g/dL (ref 30.0–36.0)
MCV: 82.8 fL (ref 78.0–100.0)
PLATELETS: 200 10*3/uL (ref 150–400)
RBC: 4.47 MIL/uL (ref 3.87–5.11)
RDW: 13.6 % (ref 11.5–15.5)
WBC: 13.4 10*3/uL — AB (ref 4.0–10.5)

## 2015-08-17 MED ORDER — OXYCODONE-ACETAMINOPHEN 5-325 MG PO TABS: 1.0000 | ORAL_TABLET | ORAL | Status: DC | PRN

## 2015-08-17 MED ORDER — IBUPROFEN 600 MG PO TABS: 600.0000 mg | ORAL_TABLET | Freq: Four times a day (QID) | ORAL | Status: DC | PRN

## 2015-08-17 MED FILL — Heparin Sodium (Porcine) Inj 5000 Unit/ML: INTRAMUSCULAR | Qty: 1 | Status: AC

## 2015-08-17 NOTE — Anesthesia Postprocedure Evaluation (Signed)
Anesthesia Post Note  Patient: Mallory Lopez  Procedure(s) Performed: Procedure(s) (LRB): LAPAROSCOPIC BILATERAL SALPINGO OOPHORECTOMY with collection of pelvic washings (Bilateral) CYSTOSCOPY (N/A) Extensive LAPAROSCOPIC LYSIS OF ADHESIONS (N/A)  Anesthesia type: General  Patient location: [facility] Unit  Post pain: Pain level controlled  Post assessment: Post-op Vital signs reviewed  Last Vitals:  Filed Vitals:   08/17/15 0549  BP: 124/67  Pulse: 86  Temp: 37 C  Resp: 18    Post vital signs: Reviewed  Level of consciousness: sedated  Complications: No apparent anesthesia complications

## 2015-08-17 NOTE — Progress Notes (Signed)
1 Day Post-Op Procedure(s) (LRB): LAPAROSCOPIC BILATERAL SALPINGO OOPHORECTOMY with collection of pelvic washings (Bilateral) CYSTOSCOPY (N/A) Extensive LAPAROSCOPIC LYSIS OF ADHESIONS (N/A)  Subjective: Patient reports tolerating PO and no problems voiding.   Ambulating well.   Objective: I have reviewed patient's vital signs, intake and output and labs. WBC 13.4, Hgb 13.3.  Glucose 125. I/O - 3992/2175 cc  General: alert and cooperative Resp: clear to auscultation bilaterally Cardio: regular rate and rhythm, S1, S2 normal, no murmur, click, rub or gallop GI: soft, non-tender; bowel sounds normal; no masses,  no organomegaly and incision: clean, dry and intact  Assessment: s/p Procedure(s): LAPAROSCOPIC BILATERAL SALPINGO OOPHORECTOMY with collection of pelvic washings (Bilateral) CYSTOSCOPY (N/A) Extensive LAPAROSCOPIC LYSIS OF ADHESIONS (N/A): progressing well and Ready for discharge.  No signs of infection.  Plan: Discharge home     Instructions and precautions given.  Rx for Percocet and Motrin.  Follow up in 2 weeks.  Reviewed surgical findings and procedure.  Questions answered.    Mallory Lopez 08/17/2015, 8:00 AM

## 2015-08-17 NOTE — Discharge Instructions (Signed)

## 2015-08-17 NOTE — Telephone Encounter (Signed)
Denise at [facility] is calling to confirm that this patient has surgery 08/16/15 for disability paper work. Please leave a message at [phone]. Langley Gauss says you can leave a message in her voice mail.

## 2015-08-17 NOTE — Addendum Note (Signed)
Addendum  created 08/17/15 0754 by Asher Muir, CRNA   Modules edited: Notes Section   Notes Section:  File: [id number]

## 2015-08-17 NOTE — Op Note (Signed)
NAMECAMRON, ESSMAN NO.:  [id number]  MEDICAL RECORD NO.:  [id number]  LOCATION:  4854                          FACILITY:  [facility]  PHYSICIAN:  Lenard Galloway, M.D.   DATE OF BIRTH:  Nov 19, 1956  DATE OF PROCEDURE:  08/16/2015 DATE OF DISCHARGE:                              OPERATIVE REPORT   PREOPERATIVE DIAGNOSIS:  Complex left adnexal mass.  POSTOPERATIVE DIAGNOSES:  Left ovarian cyst, pelvic adhesions.  PROCEDURE:  Laparoscopic bilateral salpingo-oophorectomy with collection of pelvic washings, extensive lysis of pelvic adhesions, and cystoscopy.  SURGEON:  Josefa Half, MD.  ASSISTANT:  Sumner Boast, M.D.  ANESTHESIA:  General endotracheal, local with 0.25% Marcaine.  IV FLUIDS:  1600 mL Ringer's lactate.  ESTIMATED BLOOD LOSS:  200 mL.  URINE OUTPUT:  75 mL.  COMPLICATIONS:  None.  INDICATIONS FOR PROCEDURE:  The patient is a 59 year old gravida 3, para 2-0-1-2 Caucasian female, status post total abdominal hysterectomy for uterine fibroids, who was noted to have a left adnexal mass on routine annual examination in June, 2016.  The patient had a pelvic ultrasound performed on June 30, 2015, this documented a left adnexal mass measuring 5.2 x 5.6 cm with an area of calcification measuring 5 mm. There was no abnormal vascular flow to the adnexal mass.  The right ovary appeared to be normal.  The uterus was absent.  There was no evidence of any free fluid.  The patient subsequently had a CA-125 which measured 7.  A plan is now made to proceed with a laparoscopic bilateral salpingo-oophorectomy with collection of pelvic washings.  Risks, benefits, and alternatives have been reviewed with the patient who wishes to proceed.  FINDINGS:  Examination under anesthesia revealed an absent uterus and cervix.  There was a very soft pelvic mass in the left adnexal region, and the size of the mass could not be identified by bimanual exam. Rectal examination  confirmed the above.  Laparoscopy demonstrated a left ovarian cyst measuring approximately 6 cm in diameter.  The surface of the ovary was unremarkable without papillations and excrescences.  The left fallopian tube was unremarkable.  There were very dense adhesions of the left adnexal mass to the left pelvic sidewall, the bladder, and also the sigmoid colon.  The right ovary contained a 4- 5 mm cystic area.  The right fallopian tube itself was unremarkable.  There were very dense adhesions of the right tube and ovary to the right pelvic sidewall.  The uterus was surgically absent.  In the upper abdomen, the patient did have some bowel which was adherent to the left abdominal sidewall.  The upper abdomen was otherwise unremarkable and demonstrated a normal liver and gallbladder.  Cystoscopy at the termination of the procedure documented the bladder to be intact throughout 360 degrees including the bladder dome and trigone. The ureters were noted to be patent bilaterally.  There was no evidence of a foreign body in the bladder or the urethra.  The bladder was intact.  SPECIMEN:  The bilateral fallopian tubes and ovaries were sent to Pathology separately from the pelvic washings.  DESCRIPTION OF PROCEDURE:  The patient was reidentified  in the preoperative hold area.  She did receive Ancef 2 g IV for antibiotic prophylaxis, and she received TED hose and PAS stockings for DVT prophylaxis.  In the operating room, the patient was placed in the supine position on the operating room table, and general endotracheal anesthesia was induced.  The patient was then placed in the dorsal lithotomy position with the [facility] stirrups.  The abdomen and vagina were sterilely prepped, and the patient was draped.  A Foley catheter was placed inside the urinary bladder sterilely prior to draping the patient. A sterile sponge on a sponge stick was placed in the vagina and removed at the end of the  surgery.  The procedure began by injecting the infraumbilical region with 4.09% Marcaine.  A 1 cm inferior umbilical incision was then created sharply with a scalpel.  The 10 mm trocar was inserted directly into the peritoneal cavity, and the laparoscope confirmed proper placement.  A CO2 pneumoperitoneum was achieved.  The patient was placed in the Trendelenburg position.  The accessory trocars were placed at this time.  A 5-mm trocar was placed in the right lower quadrant, the left lower quadrant, and approximately 3 cm above the suprapubic region.  All trocar sites were injected with 0.25% Marcaine.  The 5 mm incisions were created, and then, 5 mm trocars were placed under direct visualization of the laparoscope.  A collection of pelvic washings was performed, and the specimen was sent to pathology.  An exploration of the pelvic organs was performed at this time, and the findings are as noted above.  At this time, extensive lysis of adhesions was performed for removal of each of the tubes and ovaries. Approximately 2 hours was spent with lysis of adhesions alone.  The left ureter was identified along the left pelvic sidewall.  The left infundibulopelvic ligament was isolated.  It was multiply cauterized with the Gyrus instrument and was then bisected using the same instrument.  The dissection of the left adnexal mass from the left pelvic sidewall, bladder, and sigmoid colon continued in a methodic and tedious fashion until the specimen was completely freed. Please note that during the lysis of adhesions of the left adnexal region, the cyst was  inadvertently entered, and clear colorless fluid was noted. The specimen was then placed in the right pericolic gutter.  Attention was turned then to the right pelvic sidewall where the right ureter was identified.  The right infundibulopelvic ligament was multiply cauterized with the Gyrus instrument and then bisected.  Extensive lysis of  adhesions was performed to free the right ovary and the fallopian tube from the dense adhesions along the right pelvic sidewall. A small bleeding in this region was treated with monopolar cautery.  Eventually, the specimen was completely freed and was placed in the posterior cul-de-sac.  The 5 mm left lower quadrant incision was extended at this time to accommodate a 10-11 mm trocar which was placed under visualization of the laparoscope.  An EndoCatch was placed inside the peritoneal cavity, and the bilateral fallopian tubes and ovaries were then placed inside the EndoCatch bag and brought out through the left lower quadrant incision.  At this time, the pelvis was irrigated and suctioned.  There was some bleeding noted along the left pelvic sidewall, which presented itself after the CO2 pneumoperitoneum had been released to remove the EndoCatch bag.  The gyrus instrument was used to carefully cauterize this region which was away from the region of the ureter and pelvic sidewall vessels.  Similar cautery needed to be performed along the left pelvic sidewall down to the left of the vaginal cuff, and this also created good hemostasis.  The right pelvic sidewall was examined at this time, and hemostasis was noted to be good.  Some Surgiflo was placed along the patient's left pelvic sidewall at this time. Hemostasis was excellent.  The left lower quadrant incision was closed along the fascia with the Lisette Abu.  Two sutures of 0 Vicryl were placed.  The pneumoperitoneum was completely released after the remaining laparoscopic trocars were removed under visualization of the laparoscope.  The umbilical incision was closed along the fascia with a single through and through suture of 0 Vicryl.  All skin incisions were closed with subcuticular sutures of 4-0 Vicryl.  Dermabond was placed over the incisions, and a honeycomb dressing was placed over the umbilical and the left lower  quadrant incision.  Cystoscopy was performed at this time, and the findings are as noted above.  A Foley catheter was left out at the termination of the procedure.  A sponge stick which had been placed inside the vagina for manipulation of the vaginal cuff was removed at this time.  This concluded the patient's procedure.  There were no complications. All needle, instrument, and sponge counts were correct.  The patient is escorted to the recovery room in stable and awake condition.     Lenard Galloway, M.D.     BES/MEDQ  D:  08/16/2015  T:  08/17/2015  Job:  [id number]

## 2015-08-17 NOTE — Progress Notes (Signed)
Pt discharged to home with husband.  Condition stable.  Reviewed discharge instructions with pt and husband together.  Pt to care via wheelchair.  No equipment for home ordered at discharge.

## 2015-08-21 NOTE — Discharge Summary (Signed)
Physician Discharge Summary  Patient ID: Mallory Lopez MRN: [id number] DOB/AGE: 07-10-1956 59 y.o.  Admit date: 08/16/2015 Discharge date:  08/17/15  Admission Diagnoses: 1.  Complex left adnexal mass.  Discharge Diagnoses:  1.  Left ovarian cyst.  2.  Pelvic adhesions.  3.  Status post laparoscopic bilateral salpingo-oophorectomy, lysis of pelvic adhesions, cystoscopy, collection of pelvic washings.  Active Problems:   S/P bilateral salpingo-oophorectomy   Discharged Condition: good  Hospital Course:  The patient was admitted on 08/17/15 for a laparoscopic bilateral salpingo-oophorectomy, lysis of pelvic adhesions, cystoscopy, collection of pelvic washings which were performed without complication while under general anesthesia.  The patient's post op course was uneventful.  She had a Percocet and Motrin for pain control. On post op day one when the patient began taking po well.  She ambulated independently and wore PAS and Ted hose for DVT prophylaxis while in bed.  The patient was able to void spontaneously after surgery. The patient's vital signs remained stable and she demonstrated no signs of infection during her hospitalization.  The patient's post op day one Hgb was 13.3 and her WBC was 14.3.  She was tolerating the this well.  Her incision(s) demonstrated no signs of erythema or significant drainage.  She was found to be in good condition and ready for discharge on post op day one.  Consults: None  Significant Diagnostic Studies: labs:  See Hospital Course.  Treatments: surgery:  laparoscopic bilateral salpingo-oophorectomy, lysis of pelvic adhesions, cystoscopy, collection of pelvic washings   Discharge Exam: Blood pressure 124/67, pulse 86, temperature 98.6 F (37 C), temperature source Oral, resp. rate 18, height 5' (1.524 m), weight 189 lb (85.73 kg), SpO2 97 %. General: alert and cooperative Resp: clear to auscultation bilaterally Cardio: regular rate and rhythm, S1, S2  normal, no murmur, click, rub or gallop GI: soft, non-tender; bowel sounds normal; no masses, no organomegaly and incision: clean, dry and intact   Disposition: 01-Home or Self Care  Instructions and precautions reviewed in verbal and written form.     Medication List    TAKE these medications        CALCIUM + D PO  Take by mouth.     cholecalciferol 1000 UNITS tablet  Commonly known as:  VITAMIN D  Take 1,000 Units by mouth daily.     GLUCOSAMINE COMPLEX PO  Take by mouth.     ibuprofen 600 MG tablet  Commonly known as:  ADVIL,MOTRIN  Take 1 tablet (600 mg total) by mouth every 6 (six) hours as needed (mild pain).     oxyCODONE-acetaminophen 5-325 MG per tablet  Commonly known as:  PERCOCET/ROXICET  Take 1-2 tablets by mouth every 4 (four) hours as needed for severe pain (moderate to severe pain (when tolerating fluids)).           Follow-up Information    Follow up with Arloa Koh, MD In 2 weeks.   Specialty:  Obstetrics and Gynecology   Contact information:   [address] [place] [address] [phone]       Signed: Arloa Koh 08/21/2015, 12:13 PM

## 2015-08-23 ENCOUNTER — Telehealth: Payer: Self-pay | Admitting: Emergency Medicine

## 2015-08-23 NOTE — Telephone Encounter (Signed)
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  The tubes had benign paratubal cysts, and the pelvic washings were benign.  There was no sign of any cancer.   This is the report that I was expecting.   Cc- Marisa Sprinkles

## 2015-08-23 NOTE — Telephone Encounter (Signed)
Spoke with patient and message from Dr. Quincy Simmonds given.  Patient verbalized understanding of results.   She will follow up as scheduled for post op appointment. Declines complaints at this time. Routing to provider for final review. Patient agreeable to disposition. Will close encounter.

## 2015-08-31 ENCOUNTER — Encounter: Payer: Self-pay | Admitting: Obstetrics and Gynecology

## 2015-08-31 ENCOUNTER — Ambulatory Visit (INDEPENDENT_AMBULATORY_CARE_PROVIDER_SITE_OTHER): Payer: Managed Care, Other (non HMO) | Admitting: Obstetrics and Gynecology

## 2015-08-31 VITALS — BP 122/80 | HR 72 | Resp 16 | Ht 60.0 in | Wt 186.0 lb

## 2015-08-31 DIAGNOSIS — Z90722 Acquired absence of ovaries, bilateral: Secondary | ICD-10-CM

## 2015-08-31 NOTE — Progress Notes (Signed)
GYNECOLOGY  VISIT   HPI: 59 y.o.   Married  Caucasian  female   [phone] with No LMP recorded. Patient has had a hysterectomy.   here for post op check.  Husband present for the entire visit today.  LAPAROSCOPIC BILATERAL SALPINGO OOPHORECTOMY with collection of pelvic washings (Bilateral Abdomen)   CYSTOSCOPY (N/A Bladder)   Extensive LAPAROSCOPIC LYSIS OF ADHESIONS (N/A Abdomen) 08/16/15      Pathology shows serous cystadenoma.    Patient states no pain post op.  Had a great experience at the [facility]: No LMP recorded. Patient has had a hysterectomy. Contraception: Hysterectomy Menopausal hormone therapy: None Last mammogram: 07/02/14 BIRADS1:neg Last pap smear: 2012 Normal         OB History    Gravida Para Term Preterm AB TAB SAB Ectopic Multiple Living   3 2 2  1     2          Patient Active Problem List   Diagnosis Date Noted  . S/P bilateral salpingo-oophorectomy 08/16/2015    Past Medical History  Diagnosis Date  . Anemia     prior to hysterectomy  . Hemochromatosis carrier   . Fibroid   . Heart murmur     functional    Past Surgical History  Procedure Laterality Date  . Abdominal hysterectomy  2002  . Appendectomy  2002  . Laparoscopic bilateral salpingo oopherectomy Bilateral 08/16/2015    Procedure: LAPAROSCOPIC BILATERAL SALPINGO OOPHORECTOMY with collection of pelvic washings;  Surgeon: Nunzio Cobbs, MD;  Location: [facility];  Service: Gynecology;  Laterality: Bilateral;  . Cystoscopy N/A 08/16/2015    Procedure: CYSTOSCOPY;  Surgeon: Nunzio Cobbs, MD;  Location: [facility];  Service: Gynecology;  Laterality: N/A;  . Laparoscopic lysis of adhesions N/A 08/16/2015    Procedure: Extensive LAPAROSCOPIC LYSIS OF ADHESIONS;  Surgeon: Nunzio Cobbs, MD;  Location: [facility];  Service: Gynecology;  Laterality: N/A;    Current Outpatient Prescriptions  Medication Sig Dispense Refill  .  Boswellia-Glucosamine-Vit D (GLUCOSAMINE COMPLEX PO) Take by mouth.    . Calcium Citrate-Vitamin D (CALCIUM + D PO) Take by mouth.    . cholecalciferol (VITAMIN D) 1000 UNITS tablet Take 1,000 Units by mouth daily.     No current facility-administered medications for this visit.     ALLERGIES: Codeine  Family History  Problem Relation Age of Onset  . Lung cancer Mother   . Prostate cancer Father   . Seizures Brother     Hx of Grand mal and Petite mal seizsures as teenager--none since  . Lung cancer Maternal Grandmother     Social History   Social History  . Marital Status: Married    Spouse Name: N/A  . Number of Children: N/A  . Years of Education: N/A   Occupational History  . Not on file.   Social History Main Topics  . Smoking status: Never Smoker   . Smokeless tobacco: Never Used  . Alcohol Use: No  . Drug Use: No  . Sexual Activity:    Partners: Male    Birth Control/ Protection: Surgical     Comment: Hysterectomy   Other Topics Concern  . Not on file   Social History Narrative    ROS:  Pertinent items are noted in HPI.  PHYSICAL EXAMINATION:    BP 122/80 mmHg  Pulse 72  Resp 16  Ht 5' (1.524 m)  Wt 186 lb (84.369  kg)  BMI 36.33 kg/m2    General appearance: alert, cooperative and appears stated age Abdomen: incisions intact with Dermabond still on, soft, non-tender; bowel sounds normal; no masses,  no organomegaly   Pelvic: External genitalia:  no lesions              Urethra:  normal appearing urethra with no masses, tenderness or lesions             Bimanual Exam:  Uterus:  uterus absent              Adnexa: no mass, fullness, tenderness  Chaperone was present for exam.  ASSESSMENT  Status post laparoscopic bilateral salpingo-oophorectomy for benign serous cystadenoma. Doing well.   PLAN  Counseled regarding pathology report.  Discussed post op activity level.  Can increase activity at 4 week post op mark.  OK to drive.  Nothing per  vagina for 6 weeks post op.  Return for annual exam in June 2017 .   An After Visit Summary was printed and given to the patient.

## 2015-09-02 ENCOUNTER — Telehealth: Payer: Self-pay | Admitting: Obstetrics and Gynecology

## 2015-09-02 NOTE — Telephone Encounter (Signed)
Patient calling requesting return to work note, patient says it needs to say that she can return to work along with any restrictions. Patient was at work is returning again Monday. To the attention of Ulysees Barns fax # [phone], please call patient when done.

## 2015-09-05 ENCOUNTER — Encounter: Payer: Self-pay | Admitting: Obstetrics and Gynecology

## 2015-09-05 NOTE — Telephone Encounter (Signed)
Letter written, signed, and is being sent out today.  Encounter closed.

## 2015-09-20 ENCOUNTER — Other Ambulatory Visit: Payer: Self-pay

## 2015-09-20 DIAGNOSIS — Z1231 Encounter for screening mammogram for malignant neoplasm of breast: Secondary | ICD-10-CM

## 2015-10-21 ENCOUNTER — Ambulatory Visit
Admission: RE | Admit: 2015-10-21 | Discharge: 2015-10-21 | Disposition: A | Discharge status: Home / Self Care | Payer: BLUE CROSS/BLUE SHIELD | Source: Ambulatory Visit

## 2015-10-21 DIAGNOSIS — Z1231 Encounter for screening mammogram for malignant neoplasm of breast: Secondary | ICD-10-CM

## 2016-06-08 ENCOUNTER — Ambulatory Visit: Payer: Managed Care, Other (non HMO) | Admitting: Obstetrics and Gynecology

## 2016-07-19 DIAGNOSIS — M859 Disorder of bone density and structure, unspecified: Secondary | ICD-10-CM | POA: Diagnosis not present

## 2016-07-19 DIAGNOSIS — Z0000 Encounter for general adult medical examination without abnormal findings: Secondary | ICD-10-CM | POA: Diagnosis not present

## 2016-08-02 DIAGNOSIS — M1990 Unspecified osteoarthritis, unspecified site: Secondary | ICD-10-CM | POA: Diagnosis not present

## 2016-08-02 DIAGNOSIS — Z1389 Encounter for screening for other disorder: Secondary | ICD-10-CM | POA: Diagnosis not present

## 2016-08-02 DIAGNOSIS — R011 Cardiac murmur, unspecified: Secondary | ICD-10-CM | POA: Diagnosis not present

## 2016-08-02 DIAGNOSIS — M8580 Other specified disorders of bone density and structure, unspecified site: Secondary | ICD-10-CM | POA: Diagnosis not present

## 2016-08-02 DIAGNOSIS — Z0000 Encounter for general adult medical examination without abnormal findings: Secondary | ICD-10-CM | POA: Diagnosis not present

## 2016-08-03 DIAGNOSIS — Z1212 Encounter for screening for malignant neoplasm of rectum: Secondary | ICD-10-CM | POA: Diagnosis not present

## 2016-09-12 DIAGNOSIS — M859 Disorder of bone density and structure, unspecified: Secondary | ICD-10-CM | POA: Diagnosis not present

## 2016-10-31 DIAGNOSIS — D225 Melanocytic nevi of trunk: Secondary | ICD-10-CM | POA: Diagnosis not present

## 2016-10-31 DIAGNOSIS — D1801 Hemangioma of skin and subcutaneous tissue: Secondary | ICD-10-CM | POA: Diagnosis not present

## 2016-10-31 DIAGNOSIS — D2261 Melanocytic nevi of right upper limb, including shoulder: Secondary | ICD-10-CM | POA: Diagnosis not present

## 2016-10-31 DIAGNOSIS — L821 Other seborrheic keratosis: Secondary | ICD-10-CM | POA: Diagnosis not present

## 2017-08-27 DIAGNOSIS — M859 Disorder of bone density and structure, unspecified: Secondary | ICD-10-CM | POA: Diagnosis not present

## 2017-08-27 DIAGNOSIS — Z0000 Encounter for general adult medical examination without abnormal findings: Secondary | ICD-10-CM | POA: Diagnosis not present

## 2017-09-03 DIAGNOSIS — R011 Cardiac murmur, unspecified: Secondary | ICD-10-CM | POA: Diagnosis not present

## 2017-09-03 DIAGNOSIS — Z1389 Encounter for screening for other disorder: Secondary | ICD-10-CM | POA: Diagnosis not present

## 2017-09-03 DIAGNOSIS — M1990 Unspecified osteoarthritis, unspecified site: Secondary | ICD-10-CM | POA: Diagnosis not present

## 2017-09-03 DIAGNOSIS — M859 Disorder of bone density and structure, unspecified: Secondary | ICD-10-CM | POA: Diagnosis not present

## 2017-09-03 DIAGNOSIS — D649 Anemia, unspecified: Secondary | ICD-10-CM | POA: Diagnosis not present

## 2017-09-03 DIAGNOSIS — Z0000 Encounter for general adult medical examination without abnormal findings: Secondary | ICD-10-CM | POA: Diagnosis not present

## 2017-09-06 DIAGNOSIS — Z1212 Encounter for screening for malignant neoplasm of rectum: Secondary | ICD-10-CM | POA: Diagnosis not present

## 2017-10-10 NOTE — Progress Notes (Signed)
61 y.o. G63P2012 Married Caucasian female here for annual exam.    Labs with PCP.  States the cholesterol is changing but she is not on medication.   Has a 5 mo grandson.  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  [employer].  PCP:  Berneta Sages, MD    No LMP recorded. Patient has had a hysterectomy.           Sexually active: Yes.   female The current method of family planning is status post hysterectomy/BSO.    Exercising: Yes.    walking Smoker:  no  Health Maintenance: Pap: 2012 Neg History of abnormal Pap:  no MMG: 10-21-15 3D Density B/Neg/BiRads1:TBC.   Colonoscopy: 2009 normal in [place] per patient;next due 2019. BMD:  2015 Result :Normal per patient--uncertain if she has some osteopenia of the hip--Dr.Avva's office TDaP: 2014 with PCP Gardasil:   no HIV: never--gives blood 2x/year Hep C: never--gives blood 2x/year Screening Labs:  Hb today: PCP, Urine today: not done   reports that she has never smoked. She has never used smokeless tobacco. She reports that she does not drink alcohol or use drugs.  Past Medical History:  Diagnosis Date  . Anemia    prior to hysterectomy  . Fibroid   . Heart murmur    functional  . Hemochromatosis carrier     Past Surgical History:  Procedure Laterality Date  . ABDOMINAL HYSTERECTOMY  2002  . APPENDECTOMY  2002  . CYSTOSCOPY N/A 08/16/2015   Procedure: CYSTOSCOPY;  Surgeon: Nunzio Cobbs, MD;  Location: [facility];  Service: Gynecology;  Laterality: N/A;  . LAPAROSCOPIC BILATERAL SALPINGO OOPHERECTOMY Bilateral 08/16/2015   Procedure: LAPAROSCOPIC BILATERAL SALPINGO OOPHORECTOMY with collection of pelvic washings;  Surgeon: Nunzio Cobbs, MD;  Location: [facility];  Service: Gynecology;  Laterality: Bilateral;  . LAPAROSCOPIC LYSIS OF ADHESIONS N/A 08/16/2015   Procedure: Extensive LAPAROSCOPIC LYSIS OF ADHESIONS;  Surgeon: Nunzio Cobbs, MD;  Location: [facility];  Service: Gynecology;   Laterality: N/A;    Current Outpatient Prescriptions  Medication Sig Dispense Refill  . Boswellia-Glucosamine-Vit D (GLUCOSAMINE COMPLEX PO) Take by mouth.    . Calcium Citrate-Vitamin D (CALCIUM + D PO) Take by mouth.    . cholecalciferol (VITAMIN D) 1000 UNITS tablet Take 1,000 Units by mouth daily.     No current facility-administered medications for this visit.     Family History  Problem Relation Age of Onset  . Lung cancer Mother   . Prostate cancer Father   . Seizures Brother        Hx of Grand mal and Petite mal seizsures as teenager--none since  . Lung cancer Maternal Grandmother     ROS:  Pertinent items are noted in HPI.  Otherwise, a comprehensive ROS was negative.  Exam:   BP 124/72 (BP Location: Right Arm, Patient Position: Sitting, Cuff Size: Large)   Pulse 70   Resp 16   Ht 5' (1.524 m)   Wt 192 lb 6.4 oz (87.3 kg)   BMI 37.58 kg/m     General appearance: alert, cooperative and appears stated age Head: Normocephalic, without obvious abnormality, atraumatic Neck: no adenopathy, supple, symmetrical, trachea midline and thyroid normal to inspection and palpation Lungs: clear to auscultation bilaterally Breasts: normal appearance, no masses or tenderness, No nipple retraction or dimpling, No nipple discharge or bleeding, No axillary or supraclavicular adenopathy Heart: regular rate and rhythm Abdomen: soft, non-tender; no masses, no  organomegaly Extremities: extremities normal, atraumatic, no cyanosis or edema Skin: Skin color, texture, turgor normal. No rashes or lesions Lymph nodes: Cervical, supraclavicular, and axillary nodes normal. No abnormal inguinal nodes palpated Neurologic: Grossly normal  Pelvic: External genitalia:  no lesions              Urethra:  normal appearing urethra with no masses, tenderness or lesions              Bartholins and Skenes: normal                 Vagina: normal appearing vagina with normal color and discharge, no lesions               Cervix:  Absent.              Pap taken: No. Bimanual Exam:  Uterus:   Absent.               Adnexa: no mass, fullness, tenderness              Rectal exam: Yes.  .  Confirms.              Anus:  normal sphincter tone, no lesions  Chaperone was present for exam.  Assessment:   Well woman visit with normal exam. Status post TAH.  Status post laparoscopic BSO. ?Osteopenia.  Obesity.   Plan: Mammogram screening discussed.  We will schedule this. Recommended self breast awareness. Pap and HR HPV as above. Guidelines for Calcium, Vitamin D, regular exercise program including cardiovascular and weight bearing exercise. Labs with PCP.  She will discuss BMD with her PCP.  She will send a copy of her labs and her last BMD to [place] here. We talked about weight loss through exercise and portion control. Follow up annually and prn.   After visit summary provided.

## 2017-10-11 ENCOUNTER — Ambulatory Visit (INDEPENDENT_AMBULATORY_CARE_PROVIDER_SITE_OTHER): Payer: BLUE CROSS/BLUE SHIELD | Admitting: Obstetrics and Gynecology

## 2017-10-11 ENCOUNTER — Other Ambulatory Visit: Payer: Self-pay | Admitting: Obstetrics and Gynecology

## 2017-10-11 ENCOUNTER — Encounter: Payer: Self-pay | Admitting: Obstetrics and Gynecology

## 2017-10-11 VITALS — BP 124/72 | HR 70 | Resp 16 | Ht 60.0 in | Wt 192.4 lb

## 2017-10-11 DIAGNOSIS — Z01419 Encounter for gynecological examination (general) (routine) without abnormal findings: Secondary | ICD-10-CM

## 2017-10-11 DIAGNOSIS — Z1231 Encounter for screening mammogram for malignant neoplasm of breast: Secondary | ICD-10-CM

## 2017-10-11 NOTE — Patient Instructions (Signed)

## 2017-10-11 NOTE — Progress Notes (Signed)
3D Screening mammogram scheduled for 11-06-17 at 0800 at [facility]. Patient agreeable to date and time.

## 2017-11-06 ENCOUNTER — Ambulatory Visit
Admission: RE | Admit: 2017-11-06 | Discharge: 2017-11-06 | Disposition: A | Discharge status: Home / Self Care | Payer: BLUE CROSS/BLUE SHIELD | Source: Ambulatory Visit | Attending: Obstetrics and Gynecology | Admitting: Obstetrics and Gynecology

## 2017-11-06 DIAGNOSIS — Z1231 Encounter for screening mammogram for malignant neoplasm of breast: Secondary | ICD-10-CM | POA: Diagnosis not present

## 2018-09-26 DIAGNOSIS — Z0000 Encounter for general adult medical examination without abnormal findings: Secondary | ICD-10-CM | POA: Diagnosis not present

## 2018-09-26 DIAGNOSIS — E7849 Other hyperlipidemia: Secondary | ICD-10-CM | POA: Diagnosis not present

## 2018-09-26 DIAGNOSIS — M859 Disorder of bone density and structure, unspecified: Secondary | ICD-10-CM | POA: Diagnosis not present

## 2018-10-09 DIAGNOSIS — M1990 Unspecified osteoarthritis, unspecified site: Secondary | ICD-10-CM | POA: Diagnosis not present

## 2018-10-09 DIAGNOSIS — M8580 Other specified disorders of bone density and structure, unspecified site: Secondary | ICD-10-CM | POA: Diagnosis not present

## 2018-10-09 DIAGNOSIS — E7849 Other hyperlipidemia: Secondary | ICD-10-CM | POA: Diagnosis not present

## 2018-10-09 DIAGNOSIS — Z1389 Encounter for screening for other disorder: Secondary | ICD-10-CM | POA: Diagnosis not present

## 2018-10-09 DIAGNOSIS — R030 Elevated blood-pressure reading, without diagnosis of hypertension: Secondary | ICD-10-CM | POA: Diagnosis not present

## 2018-10-09 DIAGNOSIS — Z0000 Encounter for general adult medical examination without abnormal findings: Secondary | ICD-10-CM | POA: Diagnosis not present

## 2018-10-10 DIAGNOSIS — Z1212 Encounter for screening for malignant neoplasm of rectum: Secondary | ICD-10-CM | POA: Diagnosis not present

## 2018-10-27 DIAGNOSIS — M859 Disorder of bone density and structure, unspecified: Secondary | ICD-10-CM | POA: Diagnosis not present

## 2018-10-27 NOTE — Progress Notes (Signed)
62 y.o. G66P2012 Married Caucasian female here for annual exam.    Monitoring her BP.  Not on medication.   Had normal blood work with PCP.   PCP:   Berneta Sages, MD  No LMP recorded. Patient has had a hysterectomy.           Sexually active: Yes.   female The current method of family planning is status post hysterectomy.    Exercising: Yes.    walking Smoker:  no  Health Maintenance: Pap: 2012 Neg History of abnormal Pap:  no MMG:  11-06-17 Neg/density B/Birads1.  She will schedule this.  Colonoscopy:  2009 normal in [place] per patient;knows due 2019 BMD: 10-27-18 Result :PCP--Dr.Avva's office--pending TDaP: 2014 Gardasil:   no HIV: never--gives blood 2x/year Hep C: never--gives blood 2x/year Screening Labs:  Hb today: PCP     reports that she has never smoked. She has never used smokeless tobacco. She reports that she does not drink alcohol or use drugs.  Past Medical History:  Diagnosis Date  . Anemia    prior to hysterectomy  . Fibroid   . Heart murmur    functional  . Hemochromatosis carrier     Past Surgical History:  Procedure Laterality Date  . ABDOMINAL HYSTERECTOMY  2002  . APPENDECTOMY  2002  . CYSTOSCOPY N/A 08/16/2015   Procedure: CYSTOSCOPY;  Surgeon: Nunzio Cobbs, MD;  Location: [facility];  Service: Gynecology;  Laterality: N/A;  . LAPAROSCOPIC BILATERAL SALPINGO OOPHERECTOMY Bilateral 08/16/2015   Procedure: LAPAROSCOPIC BILATERAL SALPINGO OOPHORECTOMY with collection of pelvic washings;  Surgeon: Nunzio Cobbs, MD;  Location: [facility];  Service: Gynecology;  Laterality: Bilateral;  . LAPAROSCOPIC LYSIS OF ADHESIONS N/A 08/16/2015   Procedure: Extensive LAPAROSCOPIC LYSIS OF ADHESIONS;  Surgeon: Nunzio Cobbs, MD;  Location: [facility];  Service: Gynecology;  Laterality: N/A;    Current Outpatient Medications  Medication Sig Dispense Refill  . [place]well[place]-Glucosamine-Vit D (GLUCOSAMINE COMPLEX PO) Take by mouth.    .  cholecalciferol (VITAMIN D) 1000 UNITS tablet Take 1,000 Units by mouth daily.     No current facility-administered medications for this visit.     Family History  Problem Relation Age of Onset  . Lung cancer Mother   . Prostate cancer Father   . Seizures Brother        Hx of Grand mal and Petite mal seizsures as teenager--none since  . Lung cancer Maternal Grandmother     Review of Systems  All other systems reviewed and are negative.   Exam:   BP 122/74 (BP Location: Right Arm, Patient Position: Sitting, Cuff Size: Large)   Pulse 76   Resp 16   Ht 5' (1.524 m)   Wt 193 lb 3.2 oz (87.6 kg)   BMI 37.73 kg/m     General appearance: alert, cooperative and appears stated age Head: Normocephalic, without obvious abnormality, atraumatic Neck: no adenopathy, supple, symmetrical, trachea midline and thyroid normal to inspection and palpation Lungs: clear to auscultation bilaterally Breasts: normal appearance, no masses or tenderness, No nipple retraction or dimpling, No nipple discharge or bleeding, No axillary or supraclavicular adenopathy Heart: regular rate and rhythm Abdomen: soft, non-tender; no masses, no organomegaly Extremities: extremities normal, atraumatic, no cyanosis or edema Skin: Skin color, texture, turgor normal. No rashes or lesions Lymph nodes: Cervical, supraclavicular, and axillary nodes normal. No abnormal inguinal nodes palpated Neurologic: Grossly normal  Pelvic: External genitalia:  no lesions  Urethra:  normal appearing urethra with no masses, tenderness or lesions              Bartholins and Skenes: normal                 Vagina: normal appearing vagina with normal color and discharge, no lesions              Cervix:  absent              Pap taken: No. Bimanual Exam:  Uterus:   absent              Adnexa: no mass, fullness, tenderness              Rectal exam: Yes.  .  Confirms.              Anus:  normal sphincter tone, no  lesions  Chaperone was present for exam.  Assessment:   Well woman visit with normal exam. Status post TAH.  Status post laparoscopic BSO. ?Osteopenia.  Results of BMD pending.  Obesity.   Plan: Mammogram screening. Recommended self breast awareness. Pap and HR HPV as above. Guidelines for Calcium, Vitamin D, regular exercise program including cardiovascular and weight bearing exercise. Colonoscopy recommended.  She will scheduled at location her husband had his done.  Copy of BMD from PCP.  Follow up annually and prn.   After visit summary provided.

## 2018-10-29 ENCOUNTER — Other Ambulatory Visit: Payer: Self-pay

## 2018-10-29 ENCOUNTER — Encounter: Payer: Self-pay | Admitting: Obstetrics and Gynecology

## 2018-10-29 ENCOUNTER — Ambulatory Visit: Payer: BLUE CROSS/BLUE SHIELD | Admitting: Obstetrics and Gynecology

## 2018-10-29 VITALS — BP 122/74 | HR 76 | Resp 16 | Ht 60.0 in | Wt 193.2 lb

## 2018-10-29 DIAGNOSIS — Z01419 Encounter for gynecological examination (general) (routine) without abnormal findings: Secondary | ICD-10-CM | POA: Diagnosis not present

## 2018-10-29 NOTE — Patient Instructions (Signed)

## 2018-12-08 DIAGNOSIS — R030 Elevated blood-pressure reading, without diagnosis of hypertension: Secondary | ICD-10-CM | POA: Diagnosis not present

## 2018-12-08 DIAGNOSIS — Z6837 Body mass index (BMI) 37.0-37.9, adult: Secondary | ICD-10-CM | POA: Diagnosis not present

## 2019-01-29 DIAGNOSIS — R030 Elevated blood-pressure reading, without diagnosis of hypertension: Secondary | ICD-10-CM | POA: Diagnosis not present

## 2019-02-03 DIAGNOSIS — R030 Elevated blood-pressure reading, without diagnosis of hypertension: Secondary | ICD-10-CM | POA: Diagnosis not present

## 2019-10-28 ENCOUNTER — Other Ambulatory Visit: Payer: Self-pay | Admitting: Obstetrics and Gynecology

## 2019-10-28 DIAGNOSIS — Z1231 Encounter for screening mammogram for malignant neoplasm of breast: Secondary | ICD-10-CM

## 2019-10-29 NOTE — Progress Notes (Signed)
63 y.o. G32P2012 Married Caucasian female here for annual exam.    Can leak a little with a sneeze.  Does Kegels.  Has a second grandchild.  Working at [employer] in [place].  No cases in Covid in the residents.  PCP: Berneta Sages, MD    No LMP recorded. Patient has had a hysterectomy.           Sexually active: Yes.    The current method of family planning is status post hysterectomy.    Exercising: Yes.    walking Smoker:  no  Health Maintenance: Pap: 2012 Neg History of abnormal Pap:  no MMG: 11-06-17 Neg/density B/BiRads1--appt. 12-22-19 Colonoscopy: 2009 normalin [place] patient;patient to see [facility] to schedule repeat colonoscopy. BMD: 10-27-18  Result :Improvement from prior study--with PCP.  See images.  TDaP:  2014 Gardasil:   no HIV: donates blood Hep C: donates blood Screening Labs:  PCP. Flu vaccine:  Will do this month at work.    reports that she has never smoked. She has never used smokeless tobacco. She reports that she does not drink alcohol or use drugs.  Past Medical History:  Diagnosis Date  . Anemia    prior to hysterectomy  . Fibroid   . Heart murmur    functional  . Hemochromatosis carrier     Past Surgical History:  Procedure Laterality Date  . ABDOMINAL HYSTERECTOMY  2002  . APPENDECTOMY  2002  . CYSTOSCOPY N/A 08/16/2015   Procedure: CYSTOSCOPY;  Surgeon: Nunzio Cobbs, MD;  Location: [facility];  Service: Gynecology;  Laterality: N/A;  . LAPAROSCOPIC BILATERAL SALPINGO OOPHERECTOMY Bilateral 08/16/2015   Procedure: LAPAROSCOPIC BILATERAL SALPINGO OOPHORECTOMY with collection of pelvic washings;  Surgeon: Nunzio Cobbs, MD;  Location: [facility];  Service: Gynecology;  Laterality: Bilateral;  . LAPAROSCOPIC LYSIS OF ADHESIONS N/A 08/16/2015   Procedure: Extensive LAPAROSCOPIC LYSIS OF ADHESIONS;  Surgeon: Nunzio Cobbs, MD;  Location: [facility];  Service: Gynecology;  Laterality: N/A;    Current  Outpatient Medications  Medication Sig Dispense Refill  . Boswellia-Glucosamine-Vit D (GLUCOSAMINE COMPLEX PO) Take by mouth.    . cholecalciferol (VITAMIN D) 1000 UNITS tablet Take 1,000 Units by mouth daily.     No current facility-administered medications for this visit.     Family History  Problem Relation Age of Onset  . Lung cancer Mother   . Prostate cancer Father   . Seizures Brother        Hx of Grand mal and Petite mal seizsures as teenager--none since  . Lung cancer Maternal Grandmother     Review of Systems  All other systems reviewed and are negative.   Exam:   BP 140/86 (Cuff Size: Large)   Pulse 70   Temp 97.6 F (36.4 C) (Temporal)   Resp 14   Ht 5' (1.524 m)   Wt 189 lb 6.4 oz (85.9 kg)   BMI 36.99 kg/m     General appearance: alert, cooperative and appears stated age Head: normocephalic, without obvious abnormality, atraumatic Neck: no adenopathy, supple, symmetrical, trachea midline and thyroid normal to inspection and palpation Lungs: clear to auscultation bilaterally Breasts: normal appearance, no masses or tenderness, No nipple retraction or dimpling, No nipple discharge or bleeding, No axillary adenopathy Heart: regular rate and rhythm Abdomen: soft, non-tender; no masses, no organomegaly Extremities: extremities normal, atraumatic, no cyanosis or edema Skin: skin color, texture, turgor normal. No rashes or lesions Lymph nodes: cervical, supraclavicular, and  axillary nodes normal. Neurologic: grossly normal  Pelvic: External genitalia:  no lesions              No abnormal inguinal nodes palpated.              Urethra:  normal appearing urethra with no masses, tenderness or lesions              Bartholins and Skenes: normal                 Vagina: normal appearing vagina with normal color and discharge, no lesions              Cervix:  absent              Pap taken: No. Bimanual Exam:  Uterus:  Absent.  Good Kegel.              Adnexa: no mass,  fullness, tenderness              Rectal exam: Yes.  .  Confirms.              Anus:  normal sphincter tone, no lesions  Chaperone was present for exam.  Assessment:   Well woman visit with normal exam. Status post TAH.  Status post laparoscopic BSO.  Mild GSI.  Plan: Mammogram screening discussed. Self breast awareness reviewed. Pap and HR HPV as above. Guidelines for Calcium, Vitamin D, regular exercise program including cardiovascular and weight bearing exercise. Kegels.  I discussed pelvic floor therapy, Impressa, and pessary use for stress incontinence. Follow up annually and prn.   After visit summary provided.

## 2019-11-02 ENCOUNTER — Other Ambulatory Visit: Payer: Self-pay

## 2019-11-02 ENCOUNTER — Ambulatory Visit: Payer: 59 | Admitting: Obstetrics and Gynecology

## 2019-11-02 ENCOUNTER — Encounter: Payer: Self-pay | Admitting: Obstetrics and Gynecology

## 2019-11-02 VITALS — BP 140/86 | HR 70 | Temp 97.6°F | Resp 14 | Ht 60.0 in | Wt 189.4 lb

## 2019-11-02 DIAGNOSIS — Z01419 Encounter for gynecological examination (general) (routine) without abnormal findings: Secondary | ICD-10-CM | POA: Diagnosis not present

## 2019-11-02 NOTE — Patient Instructions (Signed)

## 2019-11-27 ENCOUNTER — Encounter: Payer: Self-pay | Admitting: Gastroenterology

## 2019-12-14 ENCOUNTER — Ambulatory Visit (AMBULATORY_SURGERY_CENTER): Payer: 59 | Admitting: *Deleted

## 2019-12-14 ENCOUNTER — Other Ambulatory Visit: Payer: Self-pay

## 2019-12-14 VITALS — Temp 96.2°F | Ht 60.0 in | Wt 191.0 lb

## 2019-12-14 DIAGNOSIS — Z1159 Encounter for screening for other viral diseases: Secondary | ICD-10-CM

## 2019-12-14 DIAGNOSIS — Z1211 Encounter for screening for malignant neoplasm of colon: Secondary | ICD-10-CM

## 2019-12-14 MED ORDER — SUPREP BOWEL PREP KIT 17.5-3.13-1.6 GM/177ML PO SOLN: ORAL | 0 refills | Status: DC

## 2019-12-21 ENCOUNTER — Encounter: Payer: Self-pay | Admitting: Gastroenterology

## 2019-12-22 ENCOUNTER — Ambulatory Visit
Admission: RE | Admit: 2019-12-22 | Discharge: 2019-12-22 | Disposition: A | Discharge status: Home / Self Care | Payer: 59 | Source: Ambulatory Visit | Attending: Obstetrics and Gynecology | Admitting: Obstetrics and Gynecology

## 2019-12-22 ENCOUNTER — Ambulatory Visit (INDEPENDENT_AMBULATORY_CARE_PROVIDER_SITE_OTHER): Payer: 59

## 2019-12-22 ENCOUNTER — Other Ambulatory Visit: Payer: Self-pay | Admitting: Gastroenterology

## 2019-12-22 ENCOUNTER — Other Ambulatory Visit: Payer: Self-pay

## 2019-12-22 DIAGNOSIS — Z1231 Encounter for screening mammogram for malignant neoplasm of breast: Secondary | ICD-10-CM

## 2019-12-22 DIAGNOSIS — Z1159 Encounter for screening for other viral diseases: Secondary | ICD-10-CM

## 2019-12-23 LAB — SARS CORONAVIRUS 2 (TAT 6-24 HRS): SARS Coronavirus 2: NEGATIVE

## 2019-12-28 ENCOUNTER — Ambulatory Visit (AMBULATORY_SURGERY_CENTER): Payer: 59 | Admitting: Gastroenterology

## 2019-12-28 ENCOUNTER — Other Ambulatory Visit: Payer: Self-pay

## 2019-12-28 ENCOUNTER — Encounter: Payer: Self-pay | Admitting: Gastroenterology

## 2019-12-28 VITALS — BP 104/61 | HR 72 | Temp 97.9°F | Resp 14 | Ht 60.0 in | Wt 191.0 lb

## 2019-12-28 DIAGNOSIS — Z1211 Encounter for screening for malignant neoplasm of colon: Secondary | ICD-10-CM

## 2019-12-28 MED ORDER — SODIUM CHLORIDE 0.9 % IV SOLN: 500.0000 mL | Freq: Once | INTRAVENOUS | Status: DC

## 2019-12-28 NOTE — Op Note (Signed)
[facility] Patient Name: Mallory Lopez Procedure Date: 12/28/2019 11:24 AM MRN: [id number] Endoscopist: Milus Banister , MD Age: 64 Referring MD:  Date of Birth: [birth year] Gender: Female Account #: [id number] Procedure:                Colonoscopy Indications:              Screening for colorectal malignant neoplasm Medicines:                Monitored Anesthesia Care Procedure:                Pre-Anesthesia Assessment:                           - Prior to the procedure, a History and Physical                            was performed, and patient medications and                            allergies were reviewed. The patient's tolerance of                            previous anesthesia was also reviewed. The risks                            and benefits of the procedure and the sedation                            options and risks were discussed with the patient.                            All questions were answered, and informed consent                            was obtained. Prior Anticoagulants: The patient has                            taken no previous anticoagulant or antiplatelet                            agents. ASA Grade Assessment: II - A patient with                            mild systemic disease. After reviewing the risks                            and benefits, the patient was deemed in                            satisfactory condition to undergo the procedure.                           After obtaining informed consent, the colonoscope  was passed under direct vision. Throughout the                            procedure, the patient's blood pressure, pulse, and                            oxygen saturations were monitored continuously. The                            Colonoscope was introduced through the anus and                            advanced to the the cecum, identified by                            appendiceal orifice and  ileocecal valve. The                            colonoscopy was performed without difficulty. The                            patient tolerated the procedure well. The quality                            of the bowel preparation was good. The ileocecal                            valve, appendiceal orifice, and rectum were                            photographed. Scope In: 11:29:51 AM Scope Out: 11:39:55 AM Scope Withdrawal Time: 0 hours 7 minutes 16 seconds  Total Procedure Duration: 0 hours 10 minutes 4 seconds  Findings:                 A few small-mouthed diverticula were found in the                            entire colon.                           The exam was otherwise without abnormality on                            direct and retroflexion views. Complications:            No immediate complications. Estimated blood loss:                            None. Estimated Blood Loss:     Estimated blood loss: none. Impression:               - Diverticulosis in the entire examined colon.                           - The examination was otherwise normal on direct  and retroflexion views.                           - No polyps or cancers. Recommendation:           - Patient has a contact number available for                            emergencies. The signs and symptoms of potential                            delayed complications were discussed with the                            patient. Return to normal activities tomorrow.                            Written discharge instructions were provided to the                            patient.                           - Resume previous diet.                           - Continue present medications.                           - Repeat colonoscopy in 10 years for screening. You                            do not need any type of colon cancer screening                            prior to then, including annual stool  testing. Milus Banister, MD 12/28/2019 11:42:48 AM This report has been signed electronically.

## 2019-12-28 NOTE — Patient Instructions (Signed)
Read all of the handouts given to you by your recovery room nurse.  Thank-you for choosing [place] for your healthcare needs today.  YOU HAD AN ENDOSCOPIC PROCEDURE TODAY AT [facility]:   Refer to the procedure report that was given to you for any specific questions about what was found during the examination.  If the procedure report does not answer your questions, please call your gastroenterologist to clarify.  If you requested that your care partner not be given the details of your procedure findings, then the procedure report has been included in a sealed envelope for you to review at your convenience later.  YOU SHOULD EXPECT: Some feelings of bloating in the abdomen. Passage of more gas than usual.  Walking can help get rid of the air that was put into your GI tract during the procedure and reduce the bloating. If you had a lower endoscopy (such as a colonoscopy or flexible sigmoidoscopy) you may notice spotting of blood in your stool or on the toilet paper. If you underwent a bowel prep for your procedure, you may not have a normal bowel movement for a few days.  Please Note:  You might notice some irritation and congestion in your nose or some drainage.  This is from the oxygen used during your procedure.  There is no need for concern and it should clear up in a day or so.  SYMPTOMS TO REPORT IMMEDIATELY:   Following lower endoscopy (colonoscopy or flexible sigmoidoscopy):  Excessive amounts of blood in the stool  Significant tenderness or worsening of abdominal pains  Swelling of the abdomen that is new, acute  Fever of 100F or higher   For urgent or emergent issues, a gastroenterologist can be reached at any hour by calling [phone].   DIET:  We do recommend a small meal at first, but then you may proceed to your regular diet.  Drink plenty of fluids but you should avoid alcoholic beverages for 24 hours. Try to increase the fiber in your diet, and drink plenty of  water.  ACTIVITY:  You should plan to take it easy for the rest of today and you should NOT DRIVE or use heavy machinery until tomorrow (because of the sedation medicines used during the test).    FOLLOW UP: Our staff will call the number listed on your records 48-72 hours following your procedure to check on you and address any questions or concerns that you may have regarding the information given to you following your procedure. If we do not reach you, we will leave a message.  We will attempt to reach you two times.  During this call, we will ask if you have developed any symptoms of COVID 19. If you develop any symptoms (ie: fever, flu-like symptoms, shortness of breath, cough etc.) before then, please call [phone].  If you test positive for Covid 19 in the 2 weeks post procedure, please call and report this information to [place].    If any biopsies were taken you will be contacted by phone or by letter within the next 1-3 weeks.  Please call us at [phone] if you have not heard about the biopsies in 3 weeks.    SIGNATURES/CONFIDENTIALITY: You and/or your care partner have signed paperwork which will be entered into your electronic medical record.  These signatures attest to the fact that that the information above on your After Visit Summary has been reviewed and is understood.  Full responsibility of the confidentiality  of this discharge information lies with you and/or your care-partner.

## 2019-12-28 NOTE — Progress Notes (Signed)
Pt's states no medical or surgical changes since previsit or office visit. 

## 2019-12-30 ENCOUNTER — Telehealth: Payer: Self-pay

## 2019-12-30 ENCOUNTER — Telehealth: Payer: Self-pay | Admitting: *Deleted

## 2019-12-30 NOTE — Telephone Encounter (Signed)
No answer for post procedure call back. Will attempt to call back later this afternoon. SM 

## 2019-12-30 NOTE — Telephone Encounter (Signed)
2nd follow up call made.  NALM 

## 2020-11-01 NOTE — Progress Notes (Signed)
64 y.o. G57P2012 Married Caucasian female here for annual exam.    Bladder control is good.  No bowel function concerns.  No concerns with sexual functioning.   Vaccinated against Covid.  Considering the timing of the booster.  Will do flu vaccine.   Working in a long term care.  Family will visit for Thanksgiving.   PCP: Berneta Sages, MD    No LMP recorded. Patient has had a hysterectomy.           Sexually active: Yes.    The current method of family planning is status post hysterectomy.    Exercising: No.  walking Smoker:  no  Health Maintenance: Pap: 2012 Neg History of abnormal Pap:  no MMG: 12-22-19 3D/Neg/density B/BiRads1 Colonoscopy: 12-28-19 polyps;next 10 years BMD: 10-27-18  Result :Improvement from prior study--followed by PCP TDaP:  2014 Gardasil:   no HIV: donates blood Hep C: donates blood Screening Labs:  PCP.   reports that she has never smoked. She has never used smokeless tobacco. She reports that she does not drink alcohol and does not use drugs.  Past Medical History:  Diagnosis Date  . Anemia    prior to hysterectomy  . Arthritis    fingers  . Fibroid   . Heart murmur    functional  . Hemochromatosis carrier     Past Surgical History:  Procedure Laterality Date  . ABDOMINAL HYSTERECTOMY  2002  . APPENDECTOMY  2002  . COLONOSCOPY    . CYSTOSCOPY N/A 08/16/2015   Procedure: CYSTOSCOPY;  Surgeon: Nunzio Cobbs, MD;  Location: [facility];  Service: Gynecology;  Laterality: N/A;  . LAPAROSCOPIC BILATERAL SALPINGO OOPHERECTOMY Bilateral 08/16/2015   Procedure: LAPAROSCOPIC BILATERAL SALPINGO OOPHORECTOMY with collection of pelvic washings;  Surgeon: Nunzio Cobbs, MD;  Location: [facility];  Service: Gynecology;  Laterality: Bilateral;  . LAPAROSCOPIC LYSIS OF ADHESIONS N/A 08/16/2015   Procedure: Extensive LAPAROSCOPIC LYSIS OF ADHESIONS;  Surgeon: Nunzio Cobbs, MD;  Location: [facility];  Service: Gynecology;  Laterality: N/A;   . TONSILLECTOMY      Current Outpatient Medications  Medication Sig Dispense Refill  . cholecalciferol (VITAMIN D) 1000 UNITS tablet Take 1,000 Units by mouth daily.     No current facility-administered medications for this visit.    Family History  Problem Relation Age of Onset  . Lung cancer Mother   . Prostate cancer Father   . Seizures Brother        Hx of Grand mal and Petite mal seizsures as teenager--none since  . Lung cancer Maternal Grandmother   . Stomach cancer Maternal Grandfather   . Colon cancer Neg Hx   . Esophageal cancer Neg Hx   . Rectal cancer Neg Hx     Review of Systems  All other systems reviewed and are negative.   Exam:   BP 130/78 (Cuff Size: Large)   Pulse 86   Ht 5' (1.524 m)   Wt 193 lb (87.5 kg)   SpO2 97%   BMI 37.69 kg/m     General appearance: alert, cooperative and appears stated age Head: normocephalic, without obvious abnormality, atraumatic Neck: no adenopathy, supple, symmetrical, trachea midline and thyroid normal to inspection and palpation Lungs: clear to auscultation bilaterally Breasts: normal appearance, no masses or tenderness, No nipple retraction or dimpling, No nipple discharge or bleeding, No axillary adenopathy Heart: regular rate and rhythm Abdomen: soft, non-tender; no masses, no organomegaly Extremities: extremities normal, atraumatic, no cyanosis  or edema Skin: skin color, texture, turgor normal. No rashes or lesions Lymph nodes: cervical, supraclavicular, and axillary nodes normal. Neurologic: grossly normal  Pelvic: External genitalia:  no lesions              No abnormal inguinal nodes palpated.              Urethra:  normal appearing urethra with no masses, tenderness or lesions              Bartholins and Skenes: normal                 Vagina: normal appearing vagina with normal color and discharge, no lesions              Cervix: absent              Pap taken: No. Bimanual Exam:  Uterus:  normal size,  contour, position, consistency, mobility, non-tender              Adnexa: no mass, fullness, tenderness              Rectal exam: Yes.  .  Confirms.              Anus:  normal sphincter tone, no lesions  Chaperone was present for exam.  Assessment:   Well woman visit with normal exam. Status post TAH.  Status post laparoscopic BSO. Hx mild GSI.  Plan: Mammogram screening discussed. Self breast awareness reviewed. Pap and HR HPV as above. Guidelines for Calcium, Vitamin D, regular exercise program including cardiovascular and weight bearing exercise. Labs with PCP.  We discussed Covid boosters. Follow up annually and prn.

## 2020-11-02 ENCOUNTER — Encounter: Payer: Self-pay | Admitting: Obstetrics and Gynecology

## 2020-11-02 ENCOUNTER — Other Ambulatory Visit: Payer: Self-pay

## 2020-11-02 ENCOUNTER — Ambulatory Visit: Payer: PRIVATE HEALTH INSURANCE | Admitting: Obstetrics and Gynecology

## 2020-11-02 VITALS — BP 130/78 | HR 86 | Ht 60.0 in | Wt 193.0 lb

## 2020-11-02 DIAGNOSIS — Z01419 Encounter for gynecological examination (general) (routine) without abnormal findings: Secondary | ICD-10-CM

## 2020-11-02 NOTE — Patient Instructions (Signed)

## 2021-07-12 ENCOUNTER — Other Ambulatory Visit: Payer: Self-pay | Admitting: Obstetrics and Gynecology

## 2021-07-12 DIAGNOSIS — Z1231 Encounter for screening mammogram for malignant neoplasm of breast: Secondary | ICD-10-CM

## 2021-09-01 ENCOUNTER — Other Ambulatory Visit: Payer: Self-pay

## 2021-09-01 ENCOUNTER — Ambulatory Visit
Admission: RE | Admit: 2021-09-01 | Discharge: 2021-09-01 | Disposition: A | Discharge status: Home / Self Care | Payer: PRIVATE HEALTH INSURANCE | Source: Ambulatory Visit | Attending: Obstetrics and Gynecology | Admitting: Obstetrics and Gynecology

## 2021-09-01 DIAGNOSIS — Z1231 Encounter for screening mammogram for malignant neoplasm of breast: Secondary | ICD-10-CM

## 2021-11-14 ENCOUNTER — Ambulatory Visit: Payer: PRIVATE HEALTH INSURANCE | Admitting: Obstetrics and Gynecology

## 2021-11-28 ENCOUNTER — Encounter: Payer: Self-pay | Admitting: Obstetrics and Gynecology

## 2021-11-28 ENCOUNTER — Ambulatory Visit (INDEPENDENT_AMBULATORY_CARE_PROVIDER_SITE_OTHER): Payer: No Typology Code available for payment source | Admitting: Obstetrics and Gynecology

## 2021-11-28 ENCOUNTER — Other Ambulatory Visit: Payer: Self-pay

## 2021-11-28 VITALS — BP 130/82 | HR 84 | Ht 59.5 in | Wt 188.0 lb

## 2021-11-28 DIAGNOSIS — Z01419 Encounter for gynecological examination (general) (routine) without abnormal findings: Secondary | ICD-10-CM | POA: Diagnosis not present

## 2021-11-28 NOTE — Progress Notes (Signed)
65 y.o. G35P2012 Married Caucasian female here for annual exam.    Saw her PCP yesterday.  Elevated cholesterol.  Had her DEXA today at her PCP office.   Now living near [facility], [place].   PCP:  Berneta Sages, MD    No LMP recorded. Patient has had a hysterectomy.           Sexually active: Yes.    The current method of family planning is status post hysterectomy.    Exercising: No.   Some walking Smoker:  no  Health Maintenance: Pap: 2012 Neg History of abnormal Pap:  no MMG:  09-01-21 Neg/BiRads1 Colonoscopy:  12-28-19 polyps;next 10 years BMD: 10-27-18  Result :Normal--HAD ONE TODAY--PENDING TDaP: 2014 Gardasil:   no HIV: donates blood Hep C: donates blood Screening Labs: PCP.  Flu vaccine:  yesterday.  Covid booster x 1.    reports that she has never smoked. She has never used smokeless tobacco. She reports that she does not drink alcohol and does not use drugs.  Past Medical History:  Diagnosis Date   Anemia    prior to hysterectomy   Arthritis    fingers   Fibroid    Heart murmur    functional   Hemochromatosis carrier     Past Surgical History:  Procedure Laterality Date   ABDOMINAL HYSTERECTOMY  2002   APPENDECTOMY  2002   COLONOSCOPY     CYSTOSCOPY N/A 08/16/2015   Procedure: CYSTOSCOPY;  Surgeon: Nunzio Cobbs, MD;  Location: [facility];  Service: Gynecology;  Laterality: N/A;   LAPAROSCOPIC BILATERAL SALPINGO OOPHERECTOMY Bilateral 08/16/2015   Procedure: LAPAROSCOPIC BILATERAL SALPINGO OOPHORECTOMY with collection of pelvic washings;  Surgeon: Nunzio Cobbs, MD;  Location: [facility];  Service: Gynecology;  Laterality: Bilateral;   LAPAROSCOPIC LYSIS OF ADHESIONS N/A 08/16/2015   Procedure: Extensive LAPAROSCOPIC LYSIS OF ADHESIONS;  Surgeon: Nunzio Cobbs, MD;  Location: [facility];  Service: Gynecology;  Laterality: N/A;   TONSILLECTOMY      Current Outpatient Medications  Medication Sig Dispense Refill   cholecalciferol (VITAMIN  D) 1000 UNITS tablet Take 1,000 Units by mouth daily.     No current facility-administered medications for this visit.    Family History  Problem Relation Age of Onset   Lung cancer Mother    Prostate cancer Father    Seizures Brother        Hx of Grand mal and Petite mal seizsures as teenager--none since   Lung cancer Maternal Grandmother    Stomach cancer Maternal Grandfather    Colon cancer Neg Hx    Esophageal cancer Neg Hx    Rectal cancer Neg Hx     Review of Systems  All other systems reviewed and are negative.  Exam:   BP 130/82   Pulse 84   Ht 4' 11.5" (1.511 m)   Wt 188 lb (85.3 kg)   SpO2 97%   BMI 37.34 kg/m     General appearance: alert, cooperative and appears stated age Head: normocephalic, without obvious abnormality, atraumatic Neck: no adenopathy, supple, symmetrical, trachea midline and thyroid normal to inspection and palpation Lungs: clear to auscultation bilaterally Breasts: normal appearance, no masses or tenderness, No nipple retraction or dimpling, No nipple discharge or bleeding, No axillary adenopathy Heart: regular rate and rhythm Abdomen: soft, non-tender; no masses, no organomegaly Extremities: extremities normal, atraumatic, no cyanosis or edema Skin: skin color, texture, turgor normal. No rashes or lesions Lymph nodes: cervical,  supraclavicular, and axillary nodes normal. Neurologic: grossly normal  Pelvic: External genitalia:  no lesions              No abnormal inguinal nodes palpated.              Urethra:  normal appearing urethra with no masses, tenderness or lesions              Bartholins and Skenes: normal                 Vagina: normal appearing vagina with normal color and discharge, no lesions              Cervix: absent              Pap taken: no Bimanual Exam:  Uterus:  absent              Adnexa: no mass, fullness, tenderness              Rectal exam: Yes.  .  Confirms.              Anus:  normal sphincter tone, no  lesions  Chaperone was present for exam:  Lawson Radar., CMA  Assessment:   Well woman visit with gynecologic exam. Status post TAH.  Status post laparoscopic BSO.  Hx mild GSI.  Plan: Mammogram screening discussed. Self breast awareness reviewed. Pap and HR HPV as above. Guidelines for Calcium, Vitamin D, regular exercise program including cardiovascular and weight bearing exercise.  Follow up annually and prn.   After visit summary provided.

## 2021-11-28 NOTE — Patient Instructions (Signed)

## 2022-11-20 NOTE — Progress Notes (Signed)
66 y.o. G33P2012 Married Caucasian female here for annual exam.    No health changes.   Working prn.  Helping care for her grandchildren.   Will see PCP in January.   PCP:   Dr.  Dagmar Hait  No LMP recorded. Patient has had a hysterectomy.           Sexually active: Yes.    The current method of family planning is status post hysterectomy.    Exercising: Yes.     Walking. Smoker:  no  Health Maintenance: Pap:  2012, negative History of abnormal Pap:  no MMG:  09/01/21, Breast Density Category B, BI-RADS CATEGORY 1 Negative.   Colonoscopy:  12/28/19 polyps, next 10 years BMD:  2-3 years ago per pt   Result  unsure.  PCP.   Difficult to read report in Epic.  TDaP:  2014 Gardasil:   no HIV: donates blood Hep C: donates blood Screening Labs:  PCP Flu vaccine:  today.   reports that she has never smoked. She has never used smokeless tobacco. She reports that she does not drink alcohol and does not use drugs.  Past Medical History:  Diagnosis Date   Anemia    prior to hysterectomy   Arthritis    fingers   Fibroid    Heart murmur    functional   Hemochromatosis carrier     Past Surgical History:  Procedure Laterality Date   ABDOMINAL HYSTERECTOMY  2002   APPENDECTOMY  2002   COLONOSCOPY     CYSTOSCOPY N/A 08/16/2015   Procedure: CYSTOSCOPY;  Surgeon: Nunzio Cobbs, MD;  Location: [facility];  Service: Gynecology;  Laterality: N/A;   LAPAROSCOPIC BILATERAL SALPINGO OOPHERECTOMY Bilateral 08/16/2015   Procedure: LAPAROSCOPIC BILATERAL SALPINGO OOPHORECTOMY with collection of pelvic washings;  Surgeon: Nunzio Cobbs, MD;  Location: [facility];  Service: Gynecology;  Laterality: Bilateral;   LAPAROSCOPIC LYSIS OF ADHESIONS N/A 08/16/2015   Procedure: Extensive LAPAROSCOPIC LYSIS OF ADHESIONS;  Surgeon: Nunzio Cobbs, MD;  Location: [facility];  Service: Gynecology;  Laterality: N/A;   TONSILLECTOMY      Current Outpatient Medications  Medication Sig Dispense  Refill   cholecalciferol (VITAMIN D) 1000 UNITS tablet Take 1,000 Units by mouth daily.     Cholecalciferol (SM VITAMIN D3) 50 MCG (2000 UT) CAPS 1 qd Oral (Patient not taking: Reported on 11/29/2022)     No current facility-administered medications for this visit.    Family History  Problem Relation Age of Onset   Lung cancer Mother    Prostate cancer Father    Seizures Brother        Hx of Grand mal and Petite mal seizsures as teenager--none since   Lung cancer Maternal Grandmother    Stomach cancer Maternal Grandfather    Colon cancer Neg Hx    Esophageal cancer Neg Hx    Rectal cancer Neg Hx     Review of Systems  All other systems reviewed and are negative.   Exam:   BP 130/84 (BP Location: Left Arm, Patient Position: Sitting, Cuff Size: Large)   Pulse 75   Ht '4\' 11"'$  (1.499 m)   Wt 188 lb (85.3 kg)   SpO2 98%   BMI 37.97 kg/m     General appearance: alert, cooperative and appears stated age Head: normocephalic, without obvious abnormality, atraumatic Neck: no adenopathy, supple, symmetrical, trachea midline and thyroid normal to inspection and palpation Lungs: clear to auscultation bilaterally Breasts: normal  appearance, no masses or tenderness, No nipple retraction or dimpling, No nipple discharge or bleeding, No axillary adenopathy Heart: regular rate and rhythm Abdomen: soft, non-tender; no masses, no organomegaly Extremities: extremities normal, atraumatic, no cyanosis or edema Skin: skin color, texture, turgor normal. No rashes or lesions Lymph nodes: cervical, supraclavicular, and axillary nodes normal. Neurologic: grossly normal  Pelvic: External genitalia:  no lesions              No abnormal inguinal nodes palpated.              Urethra:  normal appearing urethra with no masses, tenderness or lesions              Bartholins and Skenes: normal                 Vagina: normal appearing vagina with normal color and discharge, no lesions              Cervix:  absent              Pap taken: no Bimanual Exam:  Uterus: absent              Adnexa: no mass, fullness, tenderness              Rectal exam: yes.  Confirms.              Anus:  normal sphincter tone, no lesions  Chaperone was present for exam:  yes.  Assessment:   Well woman visit with gynecologic exam. Status post TAH.  Status post laparoscopic BSO.  Hx mild GSI.  Plan: Mammogram screening discussed.  She will schedule.  Self breast awareness reviewed. Pap and HR HPV as above. Guidelines for Calcium, Vitamin D, regular exercise program including cardiovascular and weight bearing exercise. Flu vaccine today. RSV and Covid vaccines discussed. She will review her last BMD done at her PCP office at her next visit there.  Follow up annually and prn.   After visit summary provided.

## 2022-11-29 ENCOUNTER — Encounter: Payer: Self-pay | Admitting: Obstetrics and Gynecology

## 2022-11-29 ENCOUNTER — Ambulatory Visit (INDEPENDENT_AMBULATORY_CARE_PROVIDER_SITE_OTHER): Payer: No Typology Code available for payment source | Admitting: Obstetrics and Gynecology

## 2022-11-29 VITALS — BP 130/84 | HR 75 | Ht 59.0 in | Wt 188.0 lb

## 2022-11-29 DIAGNOSIS — Z23 Encounter for immunization: Secondary | ICD-10-CM | POA: Diagnosis not present

## 2022-11-29 DIAGNOSIS — Z01419 Encounter for gynecological examination (general) (routine) without abnormal findings: Secondary | ICD-10-CM

## 2022-11-29 NOTE — Patient Instructions (Signed)

## 2023-06-30 IMAGING — MG MM DIGITAL SCREENING BILAT W/ TOMO AND CAD
8 series · 8 of 24 positions shown · non-contrast
Comparison: Previous exam(s).

CLINICAL DATA: Screening.

EXAM:
DIGITAL SCREENING BILATERAL MAMMOGRAM WITH TOMOSYNTHESIS AND CAD
TECHNIQUE: Bilateral screening digital craniocaudal and mediolateral oblique
mammograms were obtained. Bilateral screening digital breast
tomosynthesis was performed. The images were evaluated with
computer-aided detection.

[R MLO synth-2D]
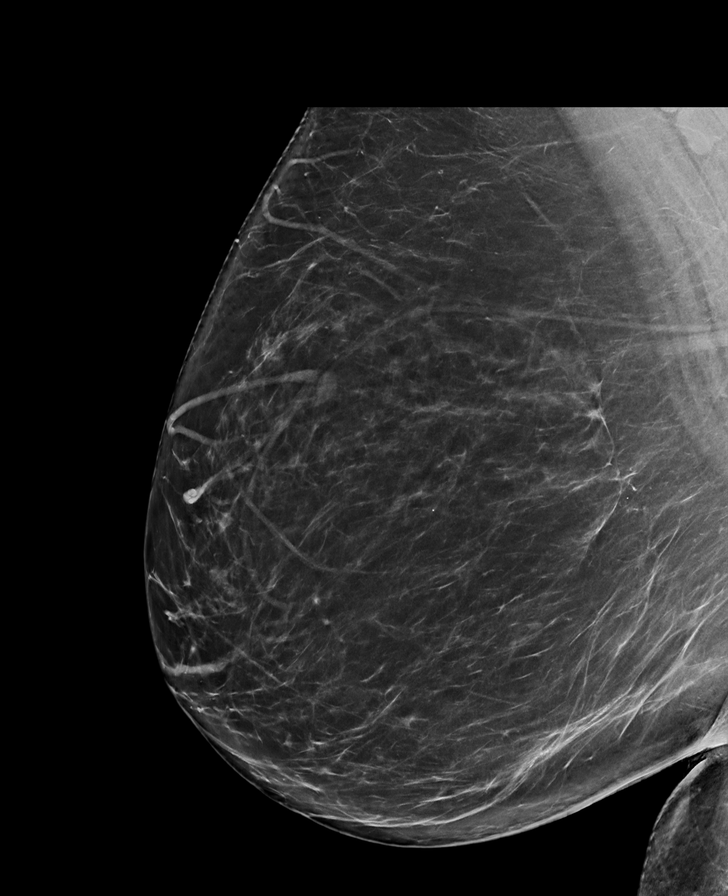

[R CC synth-2D]
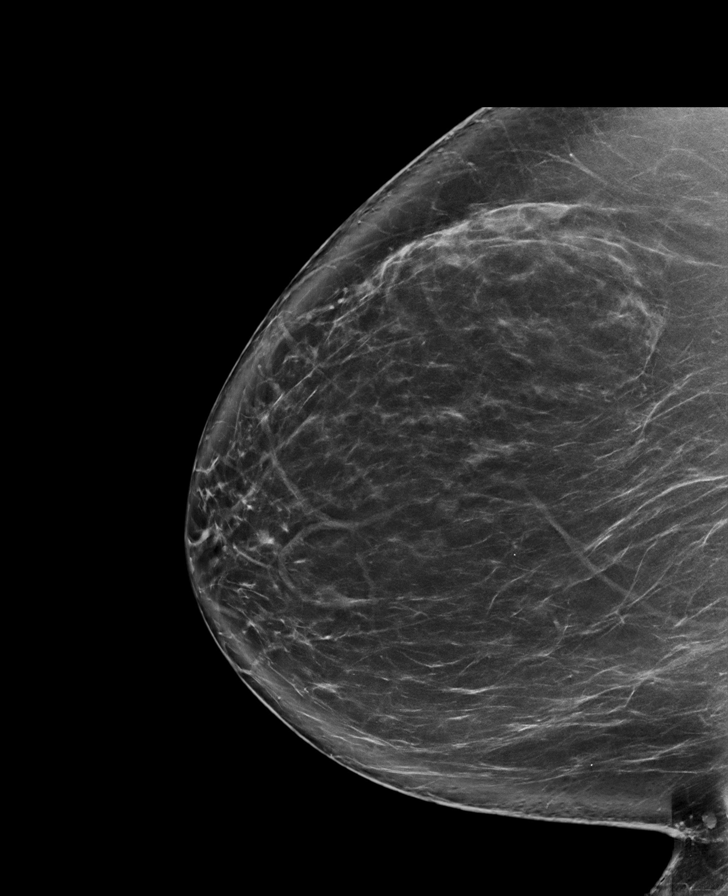

[L MLO synth-2D]
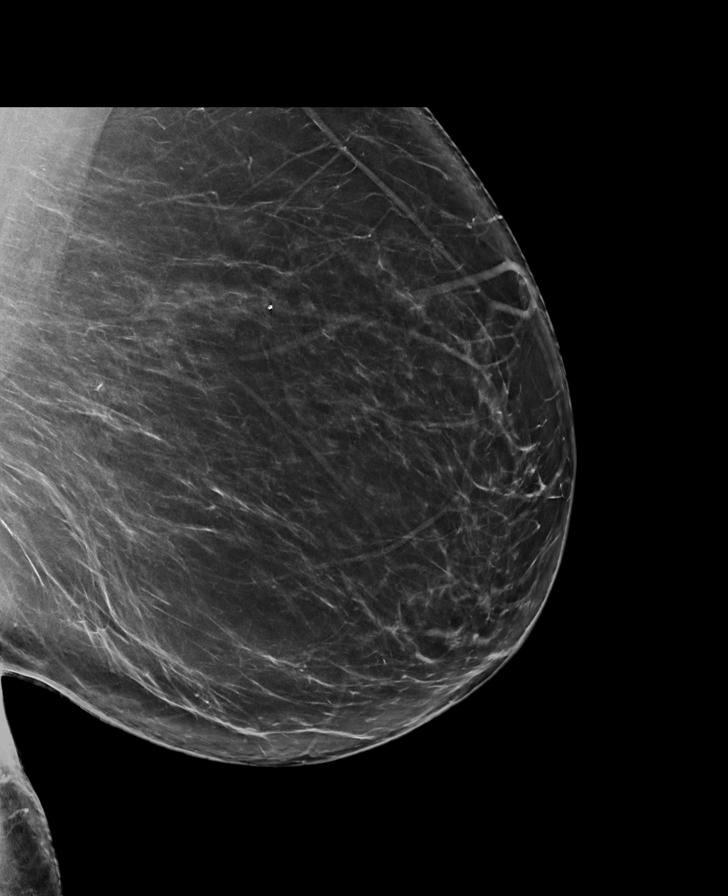

[L CC synth-2D]
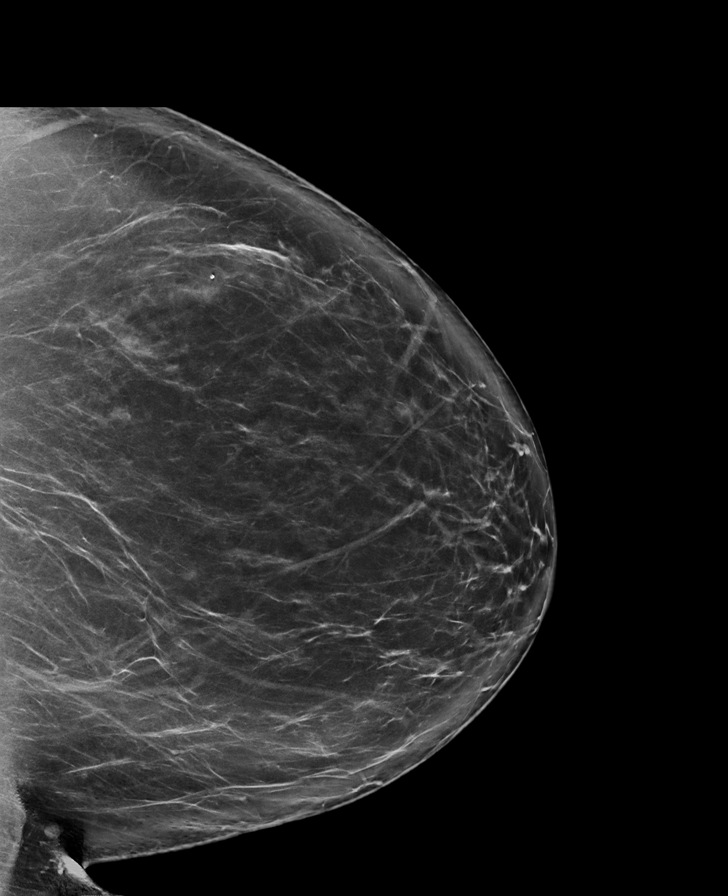

[L MLO tomo · tomo slice 47/93.0]
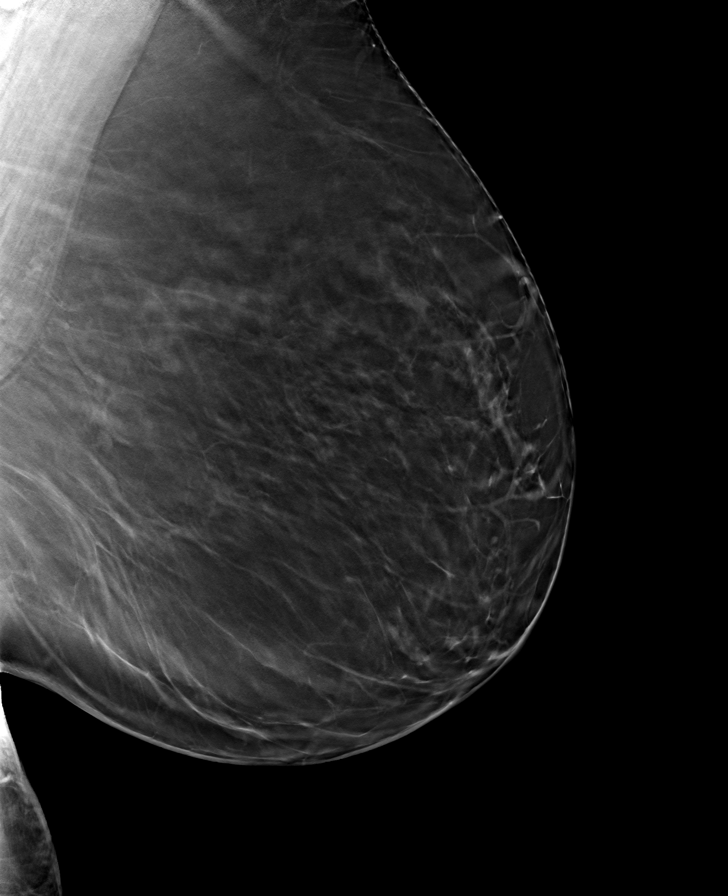

[L CC tomo · tomo slice 49/97.0]
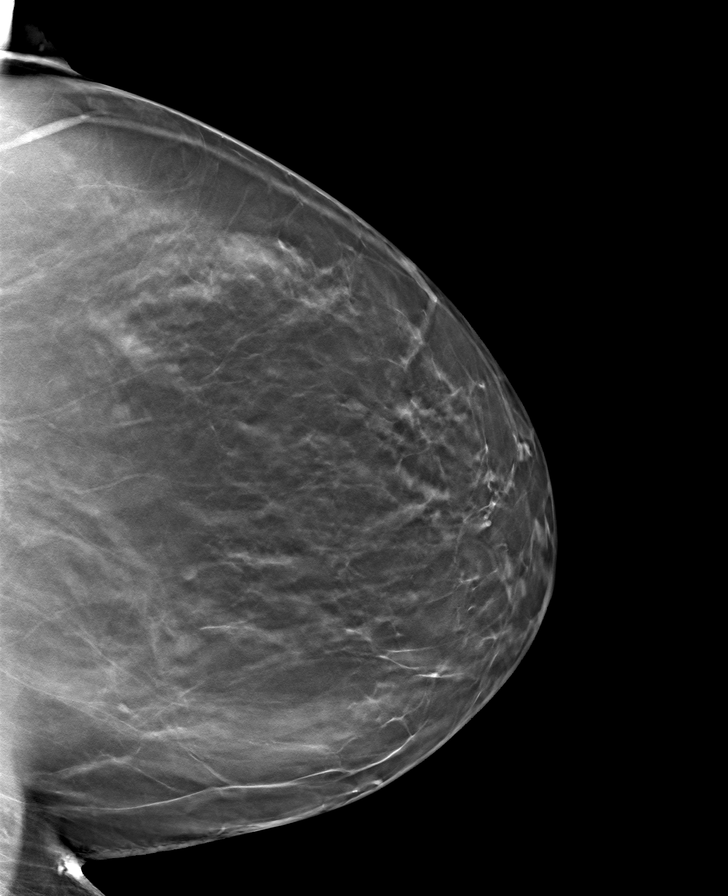

[R MLO tomo · tomo slice 47/94.0]
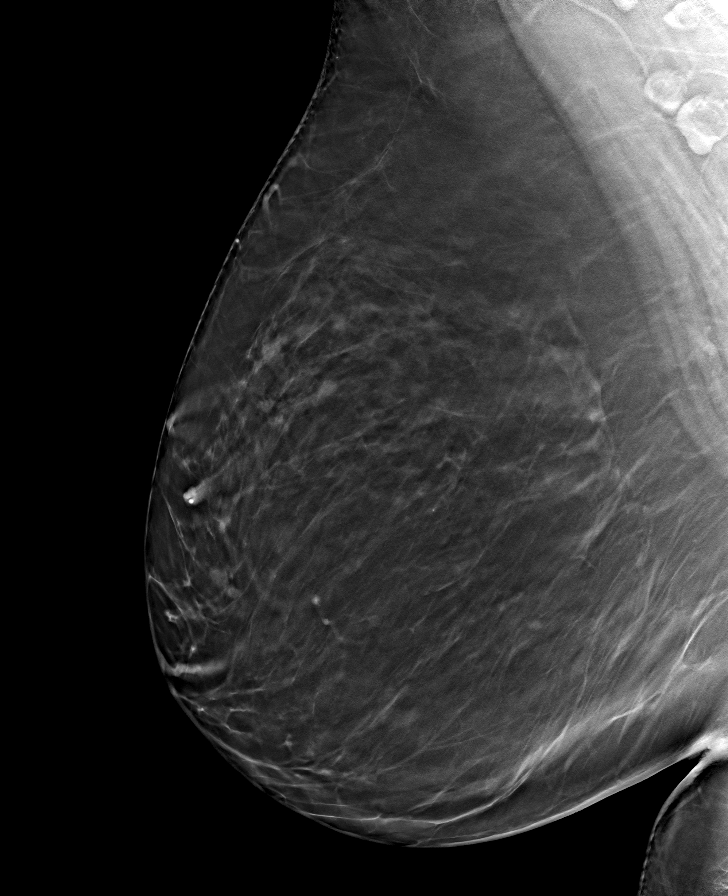

[R CC tomo · tomo slice 50/99.0]
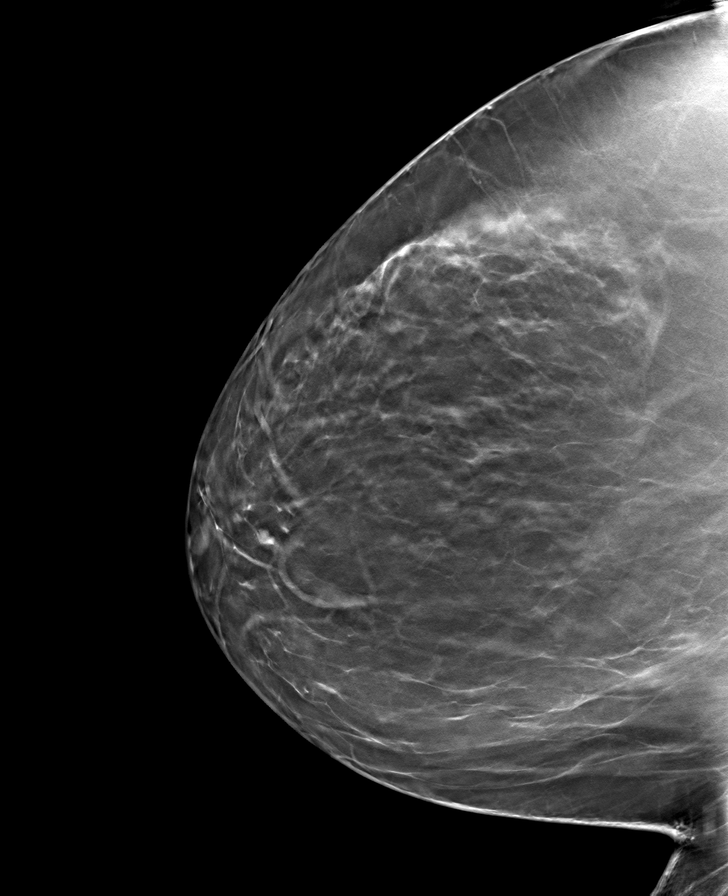

[8 of 24 positions shown; findings below may reference images not displayed]

ACR Breast Density Category b: There are scattered areas of
fibroglandular density.
FINDINGS: There are no findings suspicious for malignancy.
IMPRESSION: No mammographic evidence of malignancy. A result letter of this
screening mammogram will be mailed directly to the patient.

RECOMMENDATION:
Screening mammogram in one year. (Code:51-O-LD2)

BI-RADS CATEGORY  1: Negative.
